# Patient Record
Sex: Male | Born: 1965 | Race: Black or African American | Hispanic: No | Marital: Single | Smoking: Never smoker
Health system: Southern US, Community
[De-identification: ages and names within clinical notes are randomized; demographics above are authoritative.]

## PROBLEM LIST (undated history)

## (undated) DIAGNOSIS — I10 Essential (primary) hypertension: Secondary | ICD-10-CM

---

## 2004-01-29 ENCOUNTER — Emergency Department: Payer: Self-pay | Admitting: Emergency Medicine

## 2008-01-18 ENCOUNTER — Ambulatory Visit: Payer: Self-pay | Admitting: Urology

## 2012-02-11 ENCOUNTER — Ambulatory Visit: Payer: Self-pay | Admitting: Internal Medicine

## 2017-07-21 ENCOUNTER — Emergency Department: Payer: PRIVATE HEALTH INSURANCE

## 2017-07-21 ENCOUNTER — Emergency Department
Admission: EM | Admit: 2017-07-21 | Discharge: 2017-07-21 | Disposition: A | Discharge status: Home / Self Care | Payer: PRIVATE HEALTH INSURANCE | Attending: Emergency Medicine | Admitting: Emergency Medicine

## 2017-07-21 ENCOUNTER — Other Ambulatory Visit: Payer: Self-pay

## 2017-07-21 DIAGNOSIS — R0789 Other chest pain: Secondary | ICD-10-CM | POA: Insufficient documentation

## 2017-07-21 DIAGNOSIS — H1132 Conjunctival hemorrhage, left eye: Secondary | ICD-10-CM | POA: Diagnosis not present

## 2017-07-21 DIAGNOSIS — E860 Dehydration: Secondary | ICD-10-CM | POA: Diagnosis not present

## 2017-07-21 DIAGNOSIS — I10 Essential (primary) hypertension: Secondary | ICD-10-CM | POA: Insufficient documentation

## 2017-07-21 DIAGNOSIS — R55 Syncope and collapse: Secondary | ICD-10-CM | POA: Diagnosis not present

## 2017-07-21 DIAGNOSIS — R252 Cramp and spasm: Secondary | ICD-10-CM | POA: Insufficient documentation

## 2017-07-21 DIAGNOSIS — D709 Neutropenia, unspecified: Secondary | ICD-10-CM | POA: Insufficient documentation

## 2017-07-21 DIAGNOSIS — R079 Chest pain, unspecified: Secondary | ICD-10-CM | POA: Diagnosis present

## 2017-07-21 HISTORY — DX: Essential (primary) hypertension: I10

## 2017-07-21 LAB — CBC WITH DIFFERENTIAL/PLATELET
BASOS PCT: 1 %
Basophils Absolute: 0 10*3/uL (ref 0–0.1)
EOS PCT: 4 %
Eosinophils Absolute: 0.1 10*3/uL (ref 0–0.7)
HEMATOCRIT: 42 % (ref 40.0–52.0)
Hemoglobin: 14.1 g/dL (ref 13.0–18.0)
Lymphocytes Relative: 48 %
Lymphs Abs: 1.4 10*3/uL (ref 1.0–3.6)
MCH: 28.6 pg (ref 26.0–34.0)
MCHC: 33.7 g/dL (ref 32.0–36.0)
MCV: 84.8 fL (ref 80.0–100.0)
MONO ABS: 0.3 10*3/uL (ref 0.2–1.0)
MONOS PCT: 11 %
NEUTROS ABS: 1 10*3/uL — AB (ref 1.4–6.5)
Neutrophils Relative %: 36 %
Platelets: 204 10*3/uL (ref 150–440)
RBC: 4.95 MIL/uL (ref 4.40–5.90)
RDW: 14.2 % (ref 11.5–14.5)
WBC: 2.9 10*3/uL — ABNORMAL LOW (ref 3.8–10.6)

## 2017-07-21 LAB — TROPONIN I
Troponin I: <0.03 ng/mL (ref <0.03)
Troponin I: <0.03 ng/mL (ref <0.03)

## 2017-07-21 LAB — COMPREHENSIVE METABOLIC PANEL
ALBUMIN: 4.1 g/dL (ref 3.5–5.0)
ALT: 25 U/L (ref 0–44)
ANION GAP: 6 (ref 5–15)
AST: 26 U/L (ref 15–41)
Alkaline Phosphatase: 57 U/L (ref 38–126)
BUN: 9 mg/dL (ref 6–20)
CO2: 25 mmol/L (ref 22–32)
Calcium: 8.7 mg/dL — ABNORMAL LOW (ref 8.9–10.3)
Chloride: 106 mmol/L (ref 98–111)
Creatinine, Ser: 0.76 mg/dL (ref 0.61–1.24)
GFR calc Af Amer: >60 mL/min (ref >60)
Glucose, Bld: 104 mg/dL — ABNORMAL HIGH (ref 70–99)
POTASSIUM: 3.8 mmol/L (ref 3.5–5.1)
Sodium: 137 mmol/L (ref 135–145)
TOTAL PROTEIN: 8 g/dL (ref 6.5–8.1)
Total Bilirubin: 1 mg/dL (ref 0.3–1.2)

## 2017-07-21 LAB — CK: CK TOTAL: 293 U/L (ref 49–397)

## 2017-07-21 MED ORDER — SODIUM CHLORIDE 0.9 % IV BOLUS
1000.0000 mL | Freq: Once | INTRAVENOUS | Status: AC
  Administered 2017-07-21: 1000 mL via INTRAVENOUS

## 2017-07-21 NOTE — Discharge Instructions (Addendum)
As I explained to you, your labs show that your white blood cells are low.  It is important that you follow-up with hematology for further evaluation.  Your blood pressure was also elevated in the emergency room.  [facility] for evaluation of high blood pressure and also chest pain.  I believe that your symptoms of pain and cramping are due to dehydration.  Make sure to drink lots of fluids, avoid being out in the heat and avoid alcohol for the next 24 to 48 hours.  If you have new or worsening chest pain, shortness of breath, dizziness please return to the emergency room for evaluation.  Go to [facility] tomorrow to have your left eye evaluate. Return to the ER if you have pain with eye movement, changes in vision, or loss of vision.

## 2017-07-21 NOTE — ED Triage Notes (Signed)
Pt states that he was mowing the yard Sunday, started having cramps, and "passed out" when he went into the house Sunday - pt states that he woke up 5 min later in the floor - he reports Monday that left eye was red - he reports he is cramping and having chest pain today - chest pain is located of left side under breast

## 2017-07-21 NOTE — ED Provider Notes (Signed)
[facility] Emergency Department Provider Note  ____________________________________________  Time seen: Approximately 10:49 AM  I have reviewed the triage vital signs and the nursing notes.   HISTORY  Chief Complaint Chest Pain   HPI Mark Mccall is a 52 y.o. male with history of hypertension who presents for evaluation of leg cramping and chest pain.  Patient reports 4 days ago he was outside in the heat for 3 hours mowing the lawn.  He came back inside, ate and took a nap.  When he woke up from his nap he started having severe cramps in his bilateral lower extremities.  He walked to the kitchen to get some water when he started feeling dizzy and had a syncopal event.  He reports falling on the ground and hitting the left side of his head onto the floor.  He woke up 5 minutes later on the ground.  He did not come to the emergency room because he had to work.  Today's his first day off so he came in for evaluation.  Patient has not seen a doctor in over 10 years.  He does not take any medications at home.  He complains of continuous intermittent cramping on bilateral lower extremities.  He also reports that since yesterday every time he cramps in his legs he has a "weird" sensation in his chest.  He denies any pain or pressure in his chest or shortness of breath, he denies back pain, dizziness, lower extremity weakness or numbness, saddle anesthesia.  He denies neck pain as well.  He also reports that his left eye has been red since the syncopal event.  Denies changes in vision, or pain with eye movement.   Past Medical History:  Diagnosis Date  . Hypertension     Allergies Patient has no known allergies.  FH No h/o sudden death or heart disease  Social History Social History   Tobacco Use  . Smoking status: Never Smoker  . Smokeless tobacco: Never Used  Substance Use Topics  . Alcohol use: Never    Frequency: Never  . Drug use: Never    Review  of Systems  Constitutional: Negative for fever. + syncope Eyes: Negative for visual changes. ENT: Negative for sore throat. Neck: No neck pain  Cardiovascular: Negative for chest pain. Respiratory: Negative for shortness of breath. Gastrointestinal: Negative for abdominal pain, vomiting or diarrhea. Genitourinary: Negative for dysuria. Musculoskeletal: Negative for back pain. + muscle cramps Skin: Negative for rash. Neurological: Negative for headaches, weakness or numbness. Psych: No SI or HI  ____________________________________________   PHYSICAL EXAM:  VITAL SIGNS: ED Triage Vitals  Enc Vitals Group     BP 07/21/17 1007 (!) 152/107     Pulse Rate 07/21/17 1007 64     Resp 07/21/17 1007 16     Temp 07/21/17 1007 98.4 F (36.9 C)     Temp Source 07/21/17 1007 Oral     SpO2 07/21/17 1007 98 %     Weight 07/21/17 1004 235 lb (106.6 kg)     Height 07/21/17 1004 6\' 4"  (1.93 m)     Head Circumference --      Peak Flow --      Pain Score 07/21/17 1003 3     Pain Loc --      Pain Edu? --      Excl. in GC? --     Constitutional: Alert and oriented. No acute distress. Does not appear intoxicated. HEENT Head: Normocephalic and  atraumatic. Face: No facial bony tenderness. Stable midface Ears: No hemotympanum bilaterally. No Battle sign Eyes: No eye injury. PERRL. No raccoon eyes. Injected conjunctiva bilaterally worse on the left Nose: Nontender. No epistaxis. No rhinorrhea Mouth/Throat: Mucous membranes are moist. No oropharyngeal blood. No dental injury. Airway patent without stridor. Normal voice. Neck: no C-collar in place. No midline c-spine tenderness.  Cardiovascular: Normal rate, regular rhythm. Normal and symmetric distal pulses are present in all extremities. Pulmonary/Chest: Chest wall is stable and nontender to palpation/compression. Normal respiratory effort. Breath sounds are normal. No crepitus.  Abdominal: Soft, nontender, non distended. Musculoskeletal:  Nontender with normal full range of motion in all extremities. No deformities. No thoracic or lumbar midline spinal tenderness. Pelvis is stable. Skin: Skin is warm, dry and intact. No abrasions or contutions. Psychiatric: Speech and behavior are appropriate. Neurological: Normal speech and language. Moves all extremities to command. No gross focal neurologic deficits are appreciated.  Glascow Coma Score: 4 - Opens eyes on own 6 - Follows simple motor commands 5 - Alert and oriented GCS: 15  ____________________________________________   LABS (all labs ordered are listed, but only abnormal results are displayed)  Labs Reviewed  CBC WITH DIFFERENTIAL/PLATELET - Abnormal; Notable for the following components:      Result Value   WBC 2.9 (*)    Neutro Abs 1.0 (*)    All other components within normal limits  COMPREHENSIVE METABOLIC PANEL - Abnormal; Notable for the following components:   Glucose, Bld 104 (*)    Calcium 8.7 (*)    All other components within normal limits  TROPONIN I  CK  TROPONIN I  PATHOLOGIST SMEAR REVIEW   ____________________________________________  EKG  ED ECG REPORT I, Nita Sickle, the attending physician, personally viewed and interpreted this ECG.  Normal sinus rhythm, normal intervals, normal axis, no STE or depressions, no evidence of HOCM, AV block, delta wave, ARVD, prolonged QTc, WPW, or Brugada.   ____________________________________________  RADIOLOGY  I have personally reviewed the images performed during this visit and I agree with the Radiologist's read.   Interpretation by Radiologist:  Dg Chest 2 View  Result Date: 07/21/2017 CLINICAL DATA:  52 year old male with a history of syncope EXAM: CHEST - 2 VIEW COMPARISON:  None. FINDINGS: The heart size and mediastinal contours are within normal limits. Both lungs are clear. The visualized skeletal structures are unremarkable. IMPRESSION: Negative for acute cardiopulmonary disease  Electronically Signed   By: Gilmer Mor D.O.   On: 07/21/2017 10:21   Ct Head Wo Contrast  Result Date: 07/21/2017 CLINICAL DATA:  Recent syncopal event EXAM: CT HEAD WITHOUT CONTRAST TECHNIQUE: Contiguous axial images were obtained from the base of the skull through the vertex without intravenous contrast. COMPARISON:  None. FINDINGS: Brain: No evidence of acute infarction, hemorrhage, hydrocephalus, extra-axial collection or mass lesion/mass effect. Vascular: No hyperdense vessel or unexpected calcification. Skull: Normal. Negative for fracture or focal lesion. Sinuses/Orbits: No acute finding. Other: None. IMPRESSION: Normal head CT Electronically Signed   By: Alcide Clever M.D.   On: 07/21/2017 11:09    ____________________________________________   PROCEDURES  Procedure(s) performed: None Procedures Critical Care performed:  None ____________________________________________   INITIAL IMPRESSION / ASSESSMENT AND PLAN / ED COURSE   52 y.o. male with history of hypertension who presents for evaluation of leg cramping, syncope, and chest pain.  #syncope: most likely dehydration in the setting of exertion in the heat, decreased p.o. hydration, intermittent diffuse cramping.  EKG shows no dysrhythmias or ischemia.  Patient will be monitored on telemetry for any evidence of arrhythmias.  # bilateral leg cramping: Intermittent, started after patient mowed the lawn for 3 hours in the sun 5 days ago.  None at this time.  No edema.  No risk factors for DVT.  Bilateral lower extremities are warm and well perfused with strong distal pulses.  CK negative for rhabdo, normal kidney function, normal electrolytes.  Possibly due to dehydration.  Will give IV fluids.  # CP: Patient denies chest pain and reports that he has a weird sensation in his chest every time that his legs cramp which resolve after the cramp goes away.  EKG shows no ischemic changes.  First troponin is negative. Last episode of CP  just prior to arrival. Will get 2nd trop in 3 hours.  # leukopenia: Incidental finding of white count of 2.9 with ANC of 1K. Unclear etiology. All other cell lines are WNL. Pathology smear has been ordered. Patient will be referred to heme/onc for evaluation.  # subconjunctival hemorrhage: no evidence of iritis, normal vision, normal visual fields, PERRL. Supportive care and referral to [facility].  Clinical Course as of Jul 22 1454  Thu Jul 21, 2017  1343 Patient reports resolution of cramping. No further episodes of discomfort on his chest. Telemetry showing no evidence of dysrhythmia.  BP here elevated, recommended f/u with PCP for management, will hold off starting patient on antihypertensive as he is asymptomatic with no evidence of endorgan injury.  Patient's labs also concerning for leukopenia for which she is going to be referred to hematology for evaluation.  I discussed these findings with the patient and the recommendations for follow-up.  Also discussed recommendation to return to the emergency room if he develops chest pain, shortness of breath, weakness or numbness of his legs, abdominal pain, or back pain.   [CV]    Clinical Course User Index [CV] Don PerkingVeronese, [place], MD     As part of my medical decision making, I reviewed the following data within the electronic MEDICAL RECORD NUMBER Nursing notes reviewed and incorporated, Labs reviewed , EKG interpreted , Old chart reviewed, Radiograph reviewed , Notes from prior ED visits and [place] Controlled Substance Database    Pertinent labs & imaging results that were available during my care of the patient were reviewed by me and considered in my medical decision making (see chart for details).    ____________________________________________   FINAL CLINICAL IMPRESSION(S) / ED DIAGNOSES  Final diagnoses:  Atypical chest pain  Leg cramps  Dehydration  Neutropenia, unspecified type (HCC)  Syncope, unspecified syncope type    Subconjunctival hemorrhage of left eye      NEW MEDICATIONS STARTED DURING THIS VISIT:  ED Discharge Orders    None       Note:  This document was prepared using Dragon voice recognition software and may include unintentional dictation errors.    Don PerkingVeronese, [place], MD 07/21/17 [phone]

## 2017-07-21 NOTE — ED Notes (Signed)
Patient transported to CT 

## 2017-07-21 NOTE — ED Notes (Signed)
Dr. Benedetto CoonsVerenose made aware of BP at D/C. Pt cleared for D/C.

## 2017-07-22 LAB — PATHOLOGIST SMEAR REVIEW

## 2017-08-02 ENCOUNTER — Inpatient Hospital Stay: Payer: No Typology Code available for payment source | Attending: Oncology | Admitting: Oncology

## 2018-08-06 ENCOUNTER — Emergency Department (HOSPITAL_COMMUNITY): Payer: Self-pay

## 2018-08-06 ENCOUNTER — Encounter (HOSPITAL_COMMUNITY): Payer: Self-pay | Admitting: Emergency Medicine

## 2018-08-06 ENCOUNTER — Other Ambulatory Visit: Payer: Self-pay

## 2018-08-06 ENCOUNTER — Inpatient Hospital Stay (HOSPITAL_COMMUNITY)
Admission: EM | Admit: 2018-08-06 | Discharge: 2018-08-08 | DRG: 923 | Disposition: A | Discharge status: Home / Self Care | Payer: Self-pay | Attending: Family Medicine | Admitting: Family Medicine

## 2018-08-06 DIAGNOSIS — S12200A Unspecified displaced fracture of third cervical vertebra, initial encounter for closed fracture: Secondary | ICD-10-CM | POA: Diagnosis present

## 2018-08-06 DIAGNOSIS — R55 Syncope and collapse: Secondary | ICD-10-CM | POA: Diagnosis present

## 2018-08-06 DIAGNOSIS — R918 Other nonspecific abnormal finding of lung field: Secondary | ICD-10-CM | POA: Diagnosis present

## 2018-08-06 DIAGNOSIS — Z8042 Family history of malignant neoplasm of prostate: Secondary | ICD-10-CM

## 2018-08-06 DIAGNOSIS — Z23 Encounter for immunization: Secondary | ICD-10-CM

## 2018-08-06 DIAGNOSIS — M79632 Pain in left forearm: Secondary | ICD-10-CM | POA: Diagnosis present

## 2018-08-06 DIAGNOSIS — I951 Orthostatic hypotension: Secondary | ICD-10-CM | POA: Diagnosis present

## 2018-08-06 DIAGNOSIS — X30XXXA Exposure to excessive natural heat, initial encounter: Secondary | ICD-10-CM

## 2018-08-06 DIAGNOSIS — Y92410 Unspecified street and highway as the place of occurrence of the external cause: Secondary | ICD-10-CM

## 2018-08-06 DIAGNOSIS — I10 Essential (primary) hypertension: Secondary | ICD-10-CM | POA: Diagnosis present

## 2018-08-06 DIAGNOSIS — M7989 Other specified soft tissue disorders: Secondary | ICD-10-CM | POA: Diagnosis present

## 2018-08-06 DIAGNOSIS — Z1159 Encounter for screening for other viral diseases: Secondary | ICD-10-CM

## 2018-08-06 DIAGNOSIS — T671XXA Heat syncope, initial encounter: Principal | ICD-10-CM | POA: Diagnosis present

## 2018-08-06 DIAGNOSIS — R911 Solitary pulmonary nodule: Secondary | ICD-10-CM

## 2018-08-06 DIAGNOSIS — E860 Dehydration: Secondary | ICD-10-CM | POA: Diagnosis present

## 2018-08-06 DIAGNOSIS — Z79899 Other long term (current) drug therapy: Secondary | ICD-10-CM

## 2018-08-06 DIAGNOSIS — N179 Acute kidney failure, unspecified: Secondary | ICD-10-CM | POA: Diagnosis present

## 2018-08-06 DIAGNOSIS — R7303 Prediabetes: Secondary | ICD-10-CM

## 2018-08-06 LAB — PREPARE FRESH FROZEN PLASMA
Unit division: 0
Unit division: 0

## 2018-08-06 LAB — COMPREHENSIVE METABOLIC PANEL
ALT: 38 U/L (ref 0–44)
AST: 69 U/L — ABNORMAL HIGH (ref 15–41)
Albumin: 4.6 g/dL (ref 3.5–5.0)
Alkaline Phosphatase: 56 U/L (ref 38–126)
Anion gap: 13 (ref 5–15)
BUN: 29 mg/dL — ABNORMAL HIGH (ref 6–20)
CO2: 20 mmol/L — ABNORMAL LOW (ref 22–32)
Calcium: 9.7 mg/dL (ref 8.9–10.3)
Chloride: 102 mmol/L (ref 98–111)
Creatinine, Ser: 2.8 mg/dL — ABNORMAL HIGH (ref 0.61–1.24)
GFR calc Af Amer: 29 mL/min — ABNORMAL LOW (ref >60)
GFR calc non Af Amer: 25 mL/min — ABNORMAL LOW (ref >60)
Glucose, Bld: 134 mg/dL — ABNORMAL HIGH (ref 70–99)
Potassium: 3.9 mmol/L (ref 3.5–5.1)
Sodium: 135 mmol/L (ref 135–145)
Total Bilirubin: 1 mg/dL (ref 0.3–1.2)
Total Protein: 8.5 g/dL — ABNORMAL HIGH (ref 6.5–8.1)

## 2018-08-06 LAB — TYPE AND SCREEN
ABO/RH(D): O POS
Antibody Screen: NEGATIVE
Unit division: 0
Unit division: 0

## 2018-08-06 LAB — CBC
HCT: 46.9 % (ref 39.0–52.0)
Hemoglobin: 15.5 g/dL (ref 13.0–17.0)
MCH: 28.4 pg (ref 26.0–34.0)
MCHC: 33 g/dL (ref 30.0–36.0)
MCV: 85.9 fL (ref 80.0–100.0)
Platelets: 241 10*3/uL (ref 150–400)
RBC: 5.46 MIL/uL (ref 4.22–5.81)
RDW: 13.3 % (ref 11.5–15.5)
WBC: 8.2 10*3/uL (ref 4.0–10.5)
nRBC: 0 % (ref 0.0–0.2)

## 2018-08-06 LAB — I-STAT CHEM 8, ED
BUN: 30 mg/dL — ABNORMAL HIGH (ref 6–20)
Calcium, Ion: 1.11 mmol/L — ABNORMAL LOW (ref 1.15–1.40)
Chloride: 105 mmol/L (ref 98–111)
Creatinine, Ser: 2.7 mg/dL — ABNORMAL HIGH (ref 0.61–1.24)
Glucose, Bld: 133 mg/dL — ABNORMAL HIGH (ref 70–99)
HCT: 48 % (ref 39.0–52.0)
Hemoglobin: 16.3 g/dL (ref 13.0–17.0)
Potassium: 3.9 mmol/L (ref 3.5–5.1)
Sodium: 137 mmol/L (ref 135–145)
TCO2: 22 mmol/L (ref 22–32)

## 2018-08-06 LAB — BPAM FFP
Blood Product Expiration Date: 202008082359
Blood Product Expiration Date: 202008082359
ISSUE DATE / TIME: 202007191757
ISSUE DATE / TIME: 202007191757
Unit Type and Rh: 600
Unit Type and Rh: 6200

## 2018-08-06 LAB — BPAM RBC
Blood Product Expiration Date: 202008072359
Blood Product Expiration Date: 202008072359
ISSUE DATE / TIME: 202007191757
ISSUE DATE / TIME: 202007191757
Unit Type and Rh: 9500
Unit Type and Rh: 9500

## 2018-08-06 LAB — ABO/RH: ABO/RH(D): O POS

## 2018-08-06 LAB — CDS SEROLOGY

## 2018-08-06 LAB — CBG MONITORING, ED: Glucose-Capillary: 117 mg/dL — ABNORMAL HIGH (ref 70–99)

## 2018-08-06 LAB — PROTIME-INR
INR: 1.1 (ref 0.8–1.2)
Prothrombin Time: 14.2 seconds (ref 11.4–15.2)

## 2018-08-06 LAB — LACTIC ACID, PLASMA: Lactic Acid, Venous: 1.3 mmol/L (ref 0.5–1.9)

## 2018-08-06 LAB — ETHANOL: Alcohol, Ethyl (B): <10 mg/dL (ref <10)

## 2018-08-06 MED ORDER — FENTANYL CITRATE (PF) 100 MCG/2ML IJ SOLN
INTRAMUSCULAR | Status: AC
  Filled 2018-08-06: qty 2

## 2018-08-06 MED ORDER — TETANUS-DIPHTH-ACELL PERTUSSIS 5-2.5-18.5 LF-MCG/0.5 IM SUSP
0.5000 mL | Freq: Once | INTRAMUSCULAR | Status: AC
  Administered 2018-08-06: 0.5 mL via INTRAMUSCULAR
  Filled 2018-08-06: qty 0.5

## 2018-08-06 MED ORDER — FENTANYL CITRATE (PF) 100 MCG/2ML IJ SOLN
INTRAMUSCULAR | Status: AC | PRN
  Administered 2018-08-06: 75 ug via INTRAVENOUS

## 2018-08-06 MED ORDER — SODIUM CHLORIDE 0.9 % IV BOLUS
1000.0000 mL | Freq: Once | INTRAVENOUS | Status: AC
  Administered 2018-08-06: 1000 mL via INTRAVENOUS

## 2018-08-06 NOTE — ED Notes (Signed)
Left hand cleaned with NS
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
## 2018-08-06 NOTE — ED Notes (Signed)
On assessment- pt has sensation to left hand, movement to left hand present.
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
## 2018-08-07 ENCOUNTER — Encounter (HOSPITAL_COMMUNITY): Payer: Self-pay

## 2018-08-07 ENCOUNTER — Inpatient Hospital Stay (HOSPITAL_COMMUNITY): Payer: Self-pay

## 2018-08-07 DIAGNOSIS — N179 Acute kidney failure, unspecified: Secondary | ICD-10-CM

## 2018-08-07 DIAGNOSIS — R55 Syncope and collapse: Secondary | ICD-10-CM | POA: Diagnosis present

## 2018-08-07 DIAGNOSIS — T671XXA Heat syncope, initial encounter: Principal | ICD-10-CM

## 2018-08-07 DIAGNOSIS — R911 Solitary pulmonary nodule: Secondary | ICD-10-CM

## 2018-08-07 LAB — CBC
HCT: 42.7 % (ref 39.0–52.0)
Hemoglobin: 13.9 g/dL (ref 13.0–17.0)
MCH: 28.4 pg (ref 26.0–34.0)
MCHC: 32.6 g/dL (ref 30.0–36.0)
MCV: 87.1 fL (ref 80.0–100.0)
Platelets: 224 10*3/uL (ref 150–400)
RBC: 4.9 MIL/uL (ref 4.22–5.81)
RDW: 13.4 % (ref 11.5–15.5)
WBC: 7.2 10*3/uL (ref 4.0–10.5)
nRBC: 0 % (ref 0.0–0.2)

## 2018-08-07 LAB — URINALYSIS, ROUTINE W REFLEX MICROSCOPIC
Bilirubin Urine: NEGATIVE
Glucose, UA: NEGATIVE mg/dL
Hgb urine dipstick: NEGATIVE
Ketones, ur: NEGATIVE mg/dL
Leukocytes,Ua: NEGATIVE
Nitrite: NEGATIVE
Protein, ur: NEGATIVE mg/dL
Specific Gravity, Urine: 1.016 (ref 1.005–1.030)
pH: 6 (ref 5.0–8.0)

## 2018-08-07 LAB — COMPREHENSIVE METABOLIC PANEL
ALT: 34 U/L (ref 0–44)
AST: 58 U/L — ABNORMAL HIGH (ref 15–41)
Albumin: 3.9 g/dL (ref 3.5–5.0)
Alkaline Phosphatase: 49 U/L (ref 38–126)
Anion gap: 9 (ref 5–15)
BUN: 23 mg/dL — ABNORMAL HIGH (ref 6–20)
CO2: 22 mmol/L (ref 22–32)
Calcium: 8.6 mg/dL — ABNORMAL LOW (ref 8.9–10.3)
Chloride: 106 mmol/L (ref 98–111)
Creatinine, Ser: 1.44 mg/dL — ABNORMAL HIGH (ref 0.61–1.24)
GFR calc Af Amer: >60 mL/min (ref >60)
GFR calc non Af Amer: 55 mL/min — ABNORMAL LOW (ref >60)
Glucose, Bld: 125 mg/dL — ABNORMAL HIGH (ref 70–99)
Potassium: 4.3 mmol/L (ref 3.5–5.1)
Sodium: 137 mmol/L (ref 135–145)
Total Bilirubin: 1 mg/dL (ref 0.3–1.2)
Total Protein: 7.3 g/dL (ref 6.5–8.1)

## 2018-08-07 LAB — CK
Total CK: 1596 U/L — ABNORMAL HIGH (ref 49–397)
Total CK: 1619 U/L — ABNORMAL HIGH (ref 49–397)

## 2018-08-07 LAB — RAPID URINE DRUG SCREEN, HOSP PERFORMED
Amphetamines: NOT DETECTED
Barbiturates: NOT DETECTED
Benzodiazepines: NOT DETECTED
Cocaine: NOT DETECTED
Opiates: POSITIVE — AB
Tetrahydrocannabinol: NOT DETECTED

## 2018-08-07 LAB — BLOOD PRODUCT ORDER (VERBAL) VERIFICATION

## 2018-08-07 LAB — SARS CORONAVIRUS 2 BY RT PCR (HOSPITAL ORDER, PERFORMED IN ~~LOC~~ HOSPITAL LAB): SARS Coronavirus 2: NEGATIVE

## 2018-08-07 LAB — TROPONIN I (HIGH SENSITIVITY)
Troponin I (High Sensitivity): 4 ng/L (ref <18)
Troponin I (High Sensitivity): 5 ng/L (ref <18)

## 2018-08-07 LAB — HEMOGLOBIN A1C
Hgb A1c MFr Bld: 6 % — ABNORMAL HIGH (ref 4.8–5.6)
Mean Plasma Glucose: 125.5 mg/dL

## 2018-08-07 LAB — HIV ANTIBODY (ROUTINE TESTING W REFLEX): HIV Screen 4th Generation wRfx: NONREACTIVE

## 2018-08-07 MED ORDER — HYDROMORPHONE HCL 1 MG/ML IJ SOLN
0.5000 mg | INTRAMUSCULAR | Status: DC | PRN
  Administered 2018-08-07 – 2018-08-08 (×6): 0.5 mg via INTRAVENOUS
  Filled 2018-08-07: qty 0.5
  Filled 2018-08-07: qty 1
  Filled 2018-08-07 (×4): qty 0.5

## 2018-08-07 MED ORDER — ASPIRIN 81 MG PO CHEW
324.0000 mg | CHEWABLE_TABLET | Freq: Once | ORAL | Status: AC
  Administered 2018-08-07: 324 mg via ORAL
  Filled 2018-08-07: qty 4

## 2018-08-07 MED ORDER — ONDANSETRON HCL 4 MG/2ML IJ SOLN
4.0000 mg | Freq: Four times a day (QID) | INTRAMUSCULAR | Status: DC | PRN
  Administered 2018-08-07 – 2018-08-08 (×3): 4 mg via INTRAVENOUS
  Filled 2018-08-07 (×3): qty 2

## 2018-08-07 MED ORDER — ACETAMINOPHEN 650 MG RE SUPP: 650.0000 mg | Freq: Four times a day (QID) | RECTAL | Status: DC | PRN

## 2018-08-07 MED ORDER — HEPARIN SODIUM (PORCINE) 5000 UNIT/ML IJ SOLN
5000.0000 [IU] | Freq: Three times a day (TID) | INTRAMUSCULAR | Status: DC
  Administered 2018-08-07 – 2018-08-08 (×4): 5000 [IU] via SUBCUTANEOUS
  Filled 2018-08-07 (×4): qty 1

## 2018-08-07 MED ORDER — FENTANYL CITRATE (PF) 100 MCG/2ML IJ SOLN
50.0000 ug | Freq: Once | INTRAMUSCULAR | Status: AC
  Administered 2018-08-07: 50 ug via INTRAVENOUS
  Filled 2018-08-07: qty 2

## 2018-08-07 MED ORDER — LACTATED RINGERS IV SOLN
INTRAVENOUS | Status: DC
  Administered 2018-08-07 – 2018-08-08 (×4): via INTRAVENOUS

## 2018-08-07 MED ORDER — SODIUM CHLORIDE 0.9 % IV SOLN
Freq: Once | INTRAVENOUS | Status: AC
  Administered 2018-08-07: 03:00:00 via INTRAVENOUS

## 2018-08-07 MED ORDER — ACETAMINOPHEN 325 MG PO TABS: 650.0000 mg | ORAL_TABLET | Freq: Four times a day (QID) | ORAL | Status: DC | PRN

## 2018-08-07 MED ORDER — SODIUM CHLORIDE 0.9 % IV BOLUS
1000.0000 mL | Freq: Once | INTRAVENOUS | Status: AC
  Administered 2018-08-07: 01:00:00 1000 mL via INTRAVENOUS

## 2018-08-07 MED ORDER — ONDANSETRON HCL 4 MG PO TABS
4.0000 mg | ORAL_TABLET | Freq: Four times a day (QID) | ORAL | Status: DC | PRN
  Administered 2018-08-08 (×2): 4 mg via ORAL
  Filled 2018-08-07 (×2): qty 1

## 2018-08-07 MED ORDER — RAMELTEON 8 MG PO TABS: 8.0000 mg | ORAL_TABLET | Freq: Once | ORAL | Status: DC

## 2018-08-07 NOTE — Progress Notes (Addendum)
0918: Page sent to MD. Patient wants to sit up in bed and eat.   1010: Second page to MD. No response  1130: Explained to patient that physician wants to see him prior to eating. Verbalized understanding.  1200: Spoke with Niece Hillary. Updated on condition. States patient said he does not understand why he cannot eat.   1210: Spoke with daughter Caryl Pina. Updated on condition.  1215: Spoke with patient. Explained why he cannot eat again. Asked for primary contact. Patient states Arta Bruce is under aged and would like information given to [place].  1400: Sling applied to left arm. Ice pack in place. Currently out of [employer].
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
## 2018-08-07 NOTE — Progress Notes (Signed)
PT Cancellation Note  Patient Details Name: Mark Mccall MRN: [id number] DOB: 1965/03/13   Cancelled Treatment:    Reason Eval/Treat Not Completed: Pain limiting ability to participate;Fatigue/lethargy limiting ability to participate.  Pt is tired from OT visit and will try again at another time.   Ramond Dial 08/07/2018, 3:30 PM   Mee Hives, PT MS Acute Rehab Dept. Number: [place]

## 2018-08-07 NOTE — Progress Notes (Signed)
FPTS Interim Progress Note:   S: Paged by RN that patient is experiencing new sudden onset perceived numbness of his left hand after returning from radiology.  Evaluated patient at bedside.  Says that out of nowhere most of his left hand and the distal portion of his wrist, which from progressed when he first told his nurse that it was mainly around his thumb, felt like it "was not there."  If he touches his hand, he can feel it, however when his arm is resting to the side it is like his hand does not exist.  Interestingly, range of motion of his fingers have improved since last evaluation.  Pain slightly better after IV Dilaudid but only minimally (went to 7.5/10 from 8/10 earlier).   O: MSK exam largely unchanged from evaluation around 2030, with the exception of improved ROM of all fingers and left hand without pain on passive stretch.  Sensation to light touch intact.  Radial pulse strong and palpable with good cap refill.  Skin still slightly tense secondary to swelling, however without pain to palpation.  He is still sitting comfortably.  A/p: L hand/forearm crush injury, now with sudden perceived numbness: Concern for developing compartment syndrome remains top on differential, including the worsening swelling over the past several hours now with new sudden onset perceived numbness in his hand/wrist.  However, quite reassured he remains vascularly intact with sensation to light touch preserved and appropriate ROM.  Could also consider swelling solely secondary to crush injury vs retained 4 mm glass with an ulnar portion of hand noted on repeat x-ray tonight vs generous fluid resuscitation. Generalized increase of tissue edema notable on repeat x-rays.  Also considered concurrent C3 fracture, however would not be in the appropriate dermatome, also remains in [place] hard collar.  Ultimately, given progressing symptoms, decided to reach out to specialist for further evaluation sooner rather than later.   We will continue elevating hand with ice and pain control.  RN is attentive and aware of alarm signs/symptoms. Will monitor closely.   Reached out to orthopedist on call, Dr. Alvan Dame, who recommended speaking with the hand specialist. Paged hand specialist, Dr. Lenon Curt, through their office paging service at approximately 23:00. Will try again shortly if no answer.   Patriciaann Clan, DO

## 2018-08-07 NOTE — ED Notes (Signed)
Unsuccessful attempt to give report  rn to call  Me for report
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
## 2018-08-07 NOTE — Plan of Care (Signed)
  Problem: Pain Managment: Goal: General experience of comfort will improve Outcome: Not Progressing  Constant c/o pain in neck and left hand. Medication given as ordered. Left arm in sling, ice packs provided.

## 2018-08-07 NOTE — ED Notes (Signed)
nss switched to lactated ringers  300 from nss

## 2018-08-07 NOTE — ED Notes (Signed)
Report called to rn on 5 w 

## 2018-08-07 NOTE — Progress Notes (Signed)
Mark Mccall is a 53 y.o. male patient admitted from ED awake, alert - oriented  X 4 - no acute distress noted.  VSS - Blood pressure (!) 145/92, pulse 60, temperature 97.9 F (36.6 C), resp. rate 18, height 6\' 5"  (1.956 m), weight 97.2 kg, SpO2 100 %.    IV in place, occlusive dsg intact without redness.  Orientation to room, and floor completed with information packet given to patient/family.  Patient declined safety video at this time.  Admission INP armband ID verified with patient/family, and in place.   SR up x 2, fall assessment complete, with patient and family able to verbalize understanding of risk associated with falls, and verbalized understanding to call nsg before up out of bed.   Call light within reach, patient able to voice, and demonstrate understanding. Skin to skin check completed with second RN, skin integrity documented in chart.   Skin, clean-dry- intact without evidence of bruising.    No evidence of skin break down noted on exam.     Will cont to eval and treat per MD orders.  Howard Pouch, RN 08/07/2018 8:59 AM

## 2018-08-07 NOTE — Progress Notes (Signed)
PT Cancellation Note  Patient Details Name: Mark Mccall MRN: [id number] DOB: May 30, 1965   Cancelled Treatment:    Reason Eval/Treat Not Completed: Pain limiting ability to participate;Fatigue/lethargy limiting ability to participate.  Pt was not able to get back up after doing evaluation for OT, so will try again at another time.   Ramond Dial 08/07/2018, 3:28 PM   Mee Hives, PT MS Acute Rehab Dept. Number: [place] [facility] [place]
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
## 2018-08-08 DIAGNOSIS — R7303 Prediabetes: Secondary | ICD-10-CM

## 2018-08-08 LAB — BASIC METABOLIC PANEL
Anion gap: 8 (ref 5–15)
BUN: 11 mg/dL (ref 6–20)
CO2: 22 mmol/L (ref 22–32)
Calcium: 8.1 mg/dL — ABNORMAL LOW (ref 8.9–10.3)
Chloride: 107 mmol/L (ref 98–111)
Creatinine, Ser: 0.82 mg/dL (ref 0.61–1.24)
GFR calc Af Amer: >60 mL/min (ref >60)
GFR calc non Af Amer: >60 mL/min (ref >60)
Glucose, Bld: 90 mg/dL (ref 70–99)
Potassium: 4.4 mmol/L (ref 3.5–5.1)
Sodium: 137 mmol/L (ref 135–145)

## 2018-08-08 LAB — CK: Total CK: 968 U/L — ABNORMAL HIGH (ref 49–397)

## 2018-08-08 MED ORDER — ONDANSETRON HCL 4 MG PO TABS: 4.0000 mg | ORAL_TABLET | Freq: Three times a day (TID) | ORAL | 0 refills | Status: DC | PRN

## 2018-08-08 MED ORDER — IBUPROFEN 800 MG PO TABS: 800.0000 mg | ORAL_TABLET | Freq: Three times a day (TID) | ORAL | 0 refills | Status: AC | PRN

## 2018-08-08 MED ORDER — OXYCODONE-ACETAMINOPHEN 5-325 MG PO TABS: 1.0000 | ORAL_TABLET | ORAL | 0 refills | Status: AC | PRN

## 2018-08-08 MED ORDER — CEFAZOLIN SODIUM-DEXTROSE 2-4 GM/100ML-% IV SOLN
2.0000 g | Freq: Three times a day (TID) | INTRAVENOUS | Status: DC
  Administered 2018-08-08: 2 g via INTRAVENOUS
  Filled 2018-08-08 (×3): qty 100

## 2018-08-08 NOTE — Progress Notes (Signed)
Mark Mccall to be D/C'ed home per MD order. Discussed with the patient and all questions fully answered. Skin clean, dry and intact without evidence of skin break down, no evidence of skin tears noted.  IV catheter discontinued intact. Site without signs and symptoms of complications. Dressing and pressure applied.  An After Visit Summary was printed and given to the patient. Work note given to patient. Patient escorted via [place], and D/C home via private auto.  [name]  08/08/2018 6:18 PM
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
## 2018-08-08 NOTE — Progress Notes (Signed)
Pharmacy Antibiotic Note  Mark Mccall is a 53 y.o. male admitted on 08/06/2018 with cellulitis.  Pharmacy has been consulted for Cefazolin dosing.  Plan: Cefazolin 2 grams iv Q 8 hours Pharmacy to sign off and follow peripherally for changes in renal function  Height: 6\' 5"  (195.6 cm) Weight: 214 lb 4.6 oz (97.2 kg) IBW/kg (Calculated) : 89.1  Temp (24hrs), Avg:98.5 F (36.9 C), Min:98.4 F (36.9 C), Max:98.5 F (36.9 C)  Recent Labs  Lab 08/06/18 1835 08/06/18 1843 08/07/18 0312 08/08/18 0352  WBC 8.2  --  7.2  --   CREATININE 2.80* 2.70* 1.44* 0.82  LATICACIDVEN 1.3  --   --   --     Estimated Creatinine Clearance: 132.8 mL/min (by C-G formula based on SCr of 0.82 mg/dL).    No Known Allergies   Thank you for allowing pharmacy to be a part of this patient's care.  Tad Moore 08/08/2018 10:27 AM
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
## 2018-08-08 NOTE — Progress Notes (Signed)
Reached Dr. Lenon Curt, recommending continued conservative management.  Additionally has low concern with true compartment syndrome being more rare within the hand.  If no improvement or worsening by the morning, let us know we can reach out again for further recommendations.  Appreciated assistance, will inform RN of updates.  Will continue to monitor patient closely.  Patriciaann Clan

## 2018-08-09 NOTE — Discharge Instructions (Signed)
° °  Follow up with Neurosurgery in two weeks with Dr. Consuella Lose MD Follow up CT chest in 3-6 months with your primary care doctor  If you experience any weakness, nausea or vomiting please seek medical attention  Follow up with your PCP to monitor your blood pressure.

## 2018-08-11 ENCOUNTER — Other Ambulatory Visit: Payer: Self-pay | Admitting: Family Medicine

## 2018-08-11 ENCOUNTER — Other Ambulatory Visit (HOSPITAL_COMMUNITY): Payer: Self-pay | Admitting: Family Medicine

## 2018-08-11 DIAGNOSIS — R918 Other nonspecific abnormal finding of lung field: Secondary | ICD-10-CM

## 2018-08-17 ENCOUNTER — Ambulatory Visit: Payer: No Typology Code available for payment source

## 2018-08-24 ENCOUNTER — Ambulatory Visit: Payer: No Typology Code available for payment source

## 2018-09-04 ENCOUNTER — Other Ambulatory Visit: Payer: Self-pay | Admitting: Family Medicine

## 2018-09-04 ENCOUNTER — Other Ambulatory Visit (HOSPITAL_COMMUNITY): Payer: Self-pay | Admitting: Family Medicine

## 2018-09-04 DIAGNOSIS — M79642 Pain in left hand: Secondary | ICD-10-CM

## 2018-09-12 ENCOUNTER — Ambulatory Visit: Admission: RE | Admit: 2018-09-12 | Payer: PRIVATE HEALTH INSURANCE | Source: Ambulatory Visit

## 2018-09-19 ENCOUNTER — Ambulatory Visit
Admission: RE | Admit: 2018-09-19 | Discharge: 2018-09-19 | Disposition: A | Discharge status: Home / Self Care | Payer: PRIVATE HEALTH INSURANCE | Source: Ambulatory Visit | Attending: Family Medicine | Admitting: Family Medicine

## 2018-09-19 ENCOUNTER — Other Ambulatory Visit: Payer: Self-pay

## 2018-09-19 DIAGNOSIS — M79642 Pain in left hand: Secondary | ICD-10-CM | POA: Insufficient documentation

## 2018-09-21 ENCOUNTER — Encounter: Payer: Self-pay | Admitting: Emergency Medicine

## 2018-09-21 ENCOUNTER — Emergency Department: Payer: PRIVATE HEALTH INSURANCE

## 2018-09-21 ENCOUNTER — Other Ambulatory Visit: Payer: Self-pay

## 2018-09-21 ENCOUNTER — Emergency Department
Admission: EM | Admit: 2018-09-21 | Discharge: 2018-09-21 | Disposition: A | Discharge status: Home / Self Care | Payer: PRIVATE HEALTH INSURANCE | Attending: Student | Admitting: Student

## 2018-09-21 DIAGNOSIS — M60842 Other myositis, left hand: Secondary | ICD-10-CM

## 2018-09-21 DIAGNOSIS — Z79899 Other long term (current) drug therapy: Secondary | ICD-10-CM | POA: Diagnosis not present

## 2018-09-21 DIAGNOSIS — M60042 Infective myositis, left hand: Secondary | ICD-10-CM | POA: Diagnosis not present

## 2018-09-21 DIAGNOSIS — I10 Essential (primary) hypertension: Secondary | ICD-10-CM | POA: Diagnosis not present

## 2018-09-21 DIAGNOSIS — M79642 Pain in left hand: Secondary | ICD-10-CM | POA: Diagnosis present

## 2018-09-21 DIAGNOSIS — L03114 Cellulitis of left upper limb: Secondary | ICD-10-CM | POA: Diagnosis not present

## 2018-09-21 LAB — LACTIC ACID, PLASMA
Lactic Acid, Venous: 1.3 mmol/L (ref 0.5–1.9)
Lactic Acid, Venous: 1.1 mmol/L (ref 0.5–1.9)

## 2018-09-21 LAB — COMPREHENSIVE METABOLIC PANEL
ALT: 68 U/L — ABNORMAL HIGH (ref 0–44)
AST: 32 U/L (ref 15–41)
Albumin: 4.1 g/dL (ref 3.5–5.0)
Alkaline Phosphatase: 39 U/L (ref 38–126)
Anion gap: 11 (ref 5–15)
BUN: 25 mg/dL — ABNORMAL HIGH (ref 6–20)
CO2: 24 mmol/L (ref 22–32)
Calcium: 9.2 mg/dL (ref 8.9–10.3)
Chloride: 104 mmol/L (ref 98–111)
Creatinine, Ser: 1.01 mg/dL (ref 0.61–1.24)
GFR calc Af Amer: >60 mL/min (ref >60)
GFR calc non Af Amer: >60 mL/min (ref >60)
Glucose, Bld: 109 mg/dL — ABNORMAL HIGH (ref 70–99)
Potassium: 4.3 mmol/L (ref 3.5–5.1)
Sodium: 139 mmol/L (ref 135–145)
Total Bilirubin: 0.7 mg/dL (ref 0.3–1.2)
Total Protein: 7.6 g/dL (ref 6.5–8.1)

## 2018-09-21 LAB — CBC WITH DIFFERENTIAL/PLATELET
Abs Immature Granulocytes: 0.01 10*3/uL (ref 0.00–0.07)
Basophils Absolute: 0.1 10*3/uL (ref 0.0–0.1)
Basophils Relative: 1 %
Eosinophils Absolute: 0.1 10*3/uL (ref 0.0–0.5)
Eosinophils Relative: 2 %
HCT: 39 % (ref 39.0–52.0)
Hemoglobin: 13 g/dL (ref 13.0–17.0)
Immature Granulocytes: 0 %
Lymphocytes Relative: 26 %
Lymphs Abs: 1.2 10*3/uL (ref 0.7–4.0)
MCH: 28.4 pg (ref 26.0–34.0)
MCHC: 33.3 g/dL (ref 30.0–36.0)
MCV: 85.2 fL (ref 80.0–100.0)
Monocytes Absolute: 0.4 10*3/uL (ref 0.1–1.0)
Monocytes Relative: 8 %
Neutro Abs: 2.9 10*3/uL (ref 1.7–7.7)
Neutrophils Relative %: 63 %
Platelets: 226 10*3/uL (ref 150–400)
RBC: 4.58 MIL/uL (ref 4.22–5.81)
RDW: 13.5 % (ref 11.5–15.5)
WBC: 4.6 10*3/uL (ref 4.0–10.5)
nRBC: 0 % (ref 0.0–0.2)

## 2018-09-21 LAB — C-REACTIVE PROTEIN: CRP: <0.8 mg/dL (ref <1.0)

## 2018-09-21 LAB — SEDIMENTATION RATE: Sed Rate: 5 mm/hr (ref 0–20)

## 2018-09-21 MED ORDER — IBUPROFEN 600 MG PO TABS: 600.0000 mg | ORAL_TABLET | Freq: Four times a day (QID) | ORAL | 0 refills | Status: DC | PRN

## 2018-09-21 MED ORDER — KETOROLAC TROMETHAMINE 30 MG/ML IJ SOLN
15.0000 mg | Freq: Once | INTRAMUSCULAR | Status: AC
  Administered 2018-09-21: 15 mg via INTRAVENOUS
  Filled 2018-09-21: qty 1

## 2018-09-21 MED ORDER — OXYCODONE-ACETAMINOPHEN 5-325 MG PO TABS
1.0000 | ORAL_TABLET | Freq: Once | ORAL | Status: AC
  Administered 2018-09-21: 1 via ORAL
  Filled 2018-09-21: qty 1

## 2018-09-21 MED ORDER — VANCOMYCIN HCL 10 G IV SOLR
2000.0000 mg | Freq: Once | INTRAVENOUS | Status: AC
  Administered 2018-09-21: 2000 mg via INTRAVENOUS
  Filled 2018-09-21: qty 2000

## 2018-09-21 MED ORDER — PIPERACILLIN-TAZOBACTAM 3.375 G IVPB 30 MIN
3.3750 g | Freq: Once | INTRAVENOUS | Status: AC
  Administered 2018-09-21: 3.375 g via INTRAVENOUS
  Filled 2018-09-21: qty 50

## 2018-09-21 MED ORDER — SODIUM CHLORIDE 0.9% FLUSH: 3.0000 mL | Freq: Once | INTRAVENOUS | Status: DC

## 2018-09-21 MED ORDER — DOXYCYCLINE HYCLATE 100 MG PO CAPS: 100.0000 mg | ORAL_CAPSULE | Freq: Two times a day (BID) | ORAL | 0 refills | Status: AC
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
## 2018-09-21 NOTE — ED Notes (Signed)
Patient asking about wait time.  Explained that it may be awhile, but he really needs to stay.

## 2018-09-21 NOTE — ED Notes (Signed)
EDP, [facility], notified of completed antibiotics.

## 2018-09-21 NOTE — Discharge Instructions (Addendum)
Thank you for letting us take care of you in the emergency department today.   Please continue to take any regular, prescribed medications.   New medications we have prescribed:  - doxycycline - an antibiotic, please take as directed for the entire course - ibuprofen - please use as needed, as directed to help with inflammation and pain  Please follow up with: - The hand doctor, at your appointment tomorrow.  If for some reason you cannot make this appointment, please contact the hand doctor below to make an appointment for follow-up. - Your primary care doctor to review your ER visit and follow up on your symptoms.   Please return to the ER for any new or worsening symptoms.
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
## 2018-09-26 LAB — CULTURE, BLOOD (SINGLE)
Culture: NO GROWTH
Special Requests: ADEQUATE

## 2019-04-01 ENCOUNTER — Emergency Department
Admission: EM | Admit: 2019-04-01 | Discharge: 2019-04-01 | Disposition: A | Discharge status: Home / Self Care | Payer: Self-pay | Attending: Emergency Medicine | Admitting: Emergency Medicine

## 2019-04-01 ENCOUNTER — Encounter: Payer: Self-pay | Admitting: Physician Assistant

## 2019-04-01 ENCOUNTER — Emergency Department: Payer: Self-pay

## 2019-04-01 ENCOUNTER — Other Ambulatory Visit: Payer: Self-pay

## 2019-04-01 DIAGNOSIS — M25462 Effusion, left knee: Secondary | ICD-10-CM | POA: Insufficient documentation

## 2019-04-01 DIAGNOSIS — Z79899 Other long term (current) drug therapy: Secondary | ICD-10-CM | POA: Insufficient documentation

## 2019-04-01 DIAGNOSIS — I10 Essential (primary) hypertension: Secondary | ICD-10-CM | POA: Insufficient documentation

## 2019-04-01 NOTE — Discharge Instructions (Signed)
Your exam and XR are normal at this time. There is no evidence of fracture for dislocation. You should take OTC Tylenol for pain, as needed. Rest with ice to reduce swelling. Follow-up with Dr. Burnadette Pop for ongoing symptoms.

## 2019-04-01 NOTE — ED Provider Notes (Signed)
[facility] Emergency Department Provider Note ____________________________________________  Time seen: 1226  I have reviewed the triage vital signs and the nursing notes.  HISTORY  Chief Complaint  Leg Pain  HPI Mark [facility] is a 54 y.o. male with a history of HTN, presents to the ED for evaluation for left knee swelling. He reports onset after [place] He denies any injury, trauma, twist, or fall. He denies any history of chronic or ongoing knee problems. He denies any pain or disability. He reports fullness and swelling to the left knee.    Past Medical History:  Diagnosis Date  . Hypertension     Patient Active Problem List   Diagnosis Date Noted  . Prediabetes 08/08/2018  . Syncope 08/07/2018  . Pulmonary nodule 08/07/2018    History reviewed. No pertinent surgical history.  Prior to Admission medications   Medication Sig Start Date End Date Taking? Authorizing Provider  lisinopril (ZESTRIL) 10 MG tablet Take 10 mg by mouth daily. 06/25/18   [provider]    Allergies Patient has no known allergies.  History reviewed. No pertinent family history.  Social History Social History   Tobacco Use  . Smoking status: Never Smoker  . Smokeless tobacco: Never Used  Substance Use Topics  . Alcohol use: Not Currently  . Drug use: Never    Review of Systems  Constitutional: Negative for fever. Cardiovascular: Negative for chest pain. Respiratory: Negative for shortness of breath. Musculoskeletal: Negative for back pain. Left knee swelling.  Skin: Negative for rash. Neurological: Negative for headaches, focal weakness or numbness. ____________________________________________  PHYSICAL EXAM:  VITAL SIGNS: ED Triage Vitals  Enc Vitals Group     BP 04/01/19 1122 (S) (!) 172/112     Pulse Rate 04/01/19 1122 84     Resp 04/01/19 1122 18     Temp 04/01/19 1122 98.4 F (36.9 C)     Temp Source 04/01/19 1122  Oral     SpO2 04/01/19 1122 94 %     Weight 04/01/19 1122 225 lb (102.1 kg)     Height 04/01/19 1122 6\' 5"  (1.956 m)     Head Circumference --      Peak Flow --      Pain Score 04/01/19 1118 1     Pain Loc --      Pain Edu? --      Excl. in [place]? --     Constitutional: Alert and oriented. Well appearing and in no distress. Head: Normocephalic and atraumatic. Eyes: Conjunctivae are normal. Normal extraocular movements Cardiovascular: Normal rate, regular rhythm. Normal distal pulses. Respiratory: Normal respiratory effort. No wheezes/rales/rhonchi. Musculoskeletal: Left knee with moderate effusion noted.  Patient with normal active range of motion of the knee without difficulty.  Patella ballottement is appreciated.  No patella laxity.  No significant valgus or varus joint stress.  No tenderness to the medial or lateral joint line.  Patient is without any popliteal space fullness or calf tenderness distally.  Negative anterior/posterior drawer.  Nontender with normal range of motion in all extremities.  Neurologic:  Normal gait without ataxia. Normal speech and language. No gross focal neurologic deficits are appreciated. Skin:  Skin is warm, dry and intact. No rash noted. ____________________________________________   RADIOLOGY  DG Left Knee   IMPRESSION: Suspect small joint effusion.  No other acute finding by plain radiography ____________________________________________  PROCEDURES  Ace bandage  Procedures ____________________________________________  INITIAL IMPRESSION / ASSESSMENT AND PLAN /  ED COURSE  DDX: knee sprain, effusion, gout, septic arthritis, bursitis  Patient with ED evaluation of left knee swelling.  He denies any known injury or accident preceding the onset. His exam is benign at this time. No XR evidence of fracture or dislocation. There is no evidence of internal derangement to the joint. He is reassured by his exam and XR.  Patient is placed in Ace  bandage for support.  He is encouraged to take over-the-counter Tylenol due to his poorly controlled blood pressure.  He will follow with primary provider for ongoing symptoms or return to the ED as necessary.  He will rest, ice, elevate the knee as needed.   [facility] was evaluated in Emergency Department on 04/01/2019 for the symptoms described in the history of present illness. He was evaluated in the context of the global COVID-19 pandemic, which necessitated consideration that the patient might be at risk for infection with the SARS-CoV-2 virus that causes COVID-19. Institutional protocols and algorithms that pertain to the evaluation of patients at risk for COVID-19 are in a state of rapid change based on information released by regulatory bodies including the CDC and federal and state organizations. These policies and algorithms were followed during the patient's care in the ED. ____________________________________________  FINAL CLINICAL IMPRESSION(S) / ED DIAGNOSES  Final diagnoses:  Knee effusion, left      Karmen Stabs, Charlesetta Ivory, PA-C 04/01/19 1350    Concha Se, MD 04/02/19 1523

## 2019-04-01 NOTE — ED Triage Notes (Signed)
Pt arrived via POV with reports of left leg pain, states he was out in the woods 2 days ago, started having swelling to left side to knee, pt isn't sure if he was bit by something.   Pt ambulatory in lobby without difficulty.

## 2019-04-06 ENCOUNTER — Emergency Department
Admission: EM | Admit: 2019-04-06 | Discharge: 2019-04-06 | Disposition: A | Discharge status: Home / Self Care | Payer: PRIVATE HEALTH INSURANCE | Attending: Emergency Medicine | Admitting: Emergency Medicine

## 2019-04-06 ENCOUNTER — Other Ambulatory Visit: Payer: Self-pay

## 2019-04-06 DIAGNOSIS — M25562 Pain in left knee: Secondary | ICD-10-CM

## 2019-04-06 MED ORDER — ETODOLAC 400 MG PO TABS: 400.0000 mg | ORAL_TABLET | Freq: Two times a day (BID) | ORAL | 0 refills | Status: AC

## 2019-04-06 NOTE — Discharge Instructions (Addendum)
Call make an appointment with Dr. Franco Collet who is the orthopedist on call today.  Continue to ice and also wear the knee immobilizer when you are up walking.  Is not necessary for you to wear it to bed.  Begin taking the lodine twice a day with food for inflammation.  Also have your primary care provider recheck your blood pressure is elevated in the ED.  Remember take your Zestril every day.  It may be necessary for your primary care provider to change your blood pressure medication.

## 2019-04-06 NOTE — ED Triage Notes (Signed)
Pt seen Sunday for left knee pain. Not getting better. States it's actually getting worse. Ambulatory with pain to room.

## 2019-04-06 NOTE — ED Triage Notes (Signed)
First RN Note: Pt presents to ED via POV with c/o recheck of L knee, pt seen on 3/14 for same with swelling and pain to side of L knee, pt states is here for a recheck.

## 2019-04-06 NOTE — ED Provider Notes (Signed)
[facility] Emergency Department Provider Note   ____________________________________________   First MD Initiated Contact with Patient 04/06/19 1238     (approximate)  I have reviewed the triage vital signs and the nursing notes.   HISTORY  Chief Complaint Knee Pain   HPI Mark Mccall is a 54 y.o. male presents to the ED with complaint of left knee pain.  Patient states that he was seen on 3/14 for the same at which time x-rays showed a small area of effusion.  Patient states that he believes he was walking in the woods and may have twisted his knee.  He continues to wear the Ace wrap which he states gives little support.  He has not called make an appointment with orthopedics yet.        Past Medical History:  Diagnosis Date  . Hypertension     Patient Active Problem List   Diagnosis Date Noted  . Prediabetes 08/08/2018  . Syncope 08/07/2018  . Pulmonary nodule 08/07/2018    History reviewed. No pertinent surgical history.  Prior to Admission medications   Medication Sig Start Date End Date Taking? Authorizing Provider  etodolac (LODINE) 400 MG tablet Take 1 tablet (400 mg total) by mouth 2 (two) times daily. 04/06/19 04/05/20  Tommi Rumps, PA-C  lisinopril (ZESTRIL) 10 MG tablet Take 10 mg by mouth daily. 06/25/18   [provider]    Allergies Patient has no known allergies.  History reviewed. No pertinent family history.  Social History Social History   Tobacco Use  . Smoking status: Never Smoker  . Smokeless tobacco: Never Used  Substance Use Topics  . Alcohol use: Not Currently  . Drug use: Never    Review of Systems Constitutional: No fever/chills Eyes: No visual changes. Cardiovascular: Denies chest pain. Respiratory: Denies shortness of breath. Gastrointestinal: .  No nausea, no vomiting.  Musculoskeletal: Positive for left knee pain. Skin: Negative for rash. Neurological: Negative for headaches,  focal weakness or numbness. ____________________________________________   PHYSICAL EXAM:  VITAL SIGNS: ED Triage Vitals [04/06/19 1233]  Enc Vitals Group     BP (!) 177/109     Pulse Rate 80     Resp 18     Temp 98.4 F (36.9 C)     Temp Source Oral     SpO2 97 %     Weight 224 lb 13.9 oz (102 kg)     Height 6\' 5"  (1.956 m)     Head Circumference      Peak Flow      Pain Score 7     Pain Loc      Pain Edu?      Excl. in GC?    Constitutional: Alert and oriented. Well appearing and in no acute distress. Eyes: Conjunctivae are normal.  Head: Atraumatic. Neck: No stridor.   Cardiovascular: Normal rate, regular rhythm. Grossly normal heart sounds.  Good peripheral circulation. Respiratory: Normal respiratory effort.  No retractions. Lungs CTAB. Musculoskeletal: Examination of left knee there is no gross deformity but soft tissue edema is present.  There is no appreciated effusion.  Range of motion is decreased secondary to patient's discomfort.  There is some laxity on stressing of the lateral ligaments.  No warmth or redness is seen.  No discoloration. Neurologic:  Normal speech and language. No gross focal neurologic deficits are appreciated. No gait instability. Skin:  Skin is warm, dry and intact. Psychiatric: Mood and affect are normal. Speech and behavior  are normal.  ____________________________________________   LABS (all labs ordered are listed, but only abnormal results are displayed)  Labs Reviewed - No data to display  RADIOLOGY  ED MD interpretation: X-ray from 04/01/2018 was reviewed.  Official radiology report(s): No results found.  ____________________________________________   PROCEDURES  Procedure(s) performed (including Critical Care):  Procedures  Knee immobilizer was applied by RN. ____________________________________________   INITIAL IMPRESSION / ASSESSMENT AND PLAN / ED COURSE  As part of my medical decision making, I reviewed the  following data within the electronic MEDICAL RECORD NUMBER Notes from prior ED visits and [place] Controlled Substance Database  54 year old male presents to the ED with complaint of left knee pain.  He states that he wants to get his knee "rechecked".  Patient was seen on 314 at which time there was a small effusion that may have resulted from him walking in the woods and twisting his knee.  Patient has continued to use an Ace wrap which he states does not give much support.  He was placed in a knee immobilizer and also started on etodolac 400 mg twice daily with food.  He is to follow-up with Dr. Gaspar Bidding who is the orthopedist on call if he continues to have problems with his knee.  He is also instructed to continue ice and elevation as needed.  ____________________________________________   FINAL CLINICAL IMPRESSION(S) / ED DIAGNOSES  Final diagnoses:  Acute pain of left knee     ED Discharge Orders         Ordered    etodolac (LODINE) 400 MG tablet  2 times daily     04/06/19 1410           Note:  This document was prepared using Dragon voice recognition software and may include unintentional dictation errors.    Johnn Hai, PA-C 04/06/19 1520    Harvest Dark, MD 04/06/19 1535

## 2019-11-12 IMAGING — CR DG CHEST 2V
1 series · 2 of 2 positions shown · non-contrast
Comparison: None.

CLINICAL DATA: 51-year-old male with a history of syncope

EXAM:
CHEST - 2 VIEW

[Series 1: dg chest 2 view · 0.14mm/px · 2 of 2 slices shown]
[im 1/2]
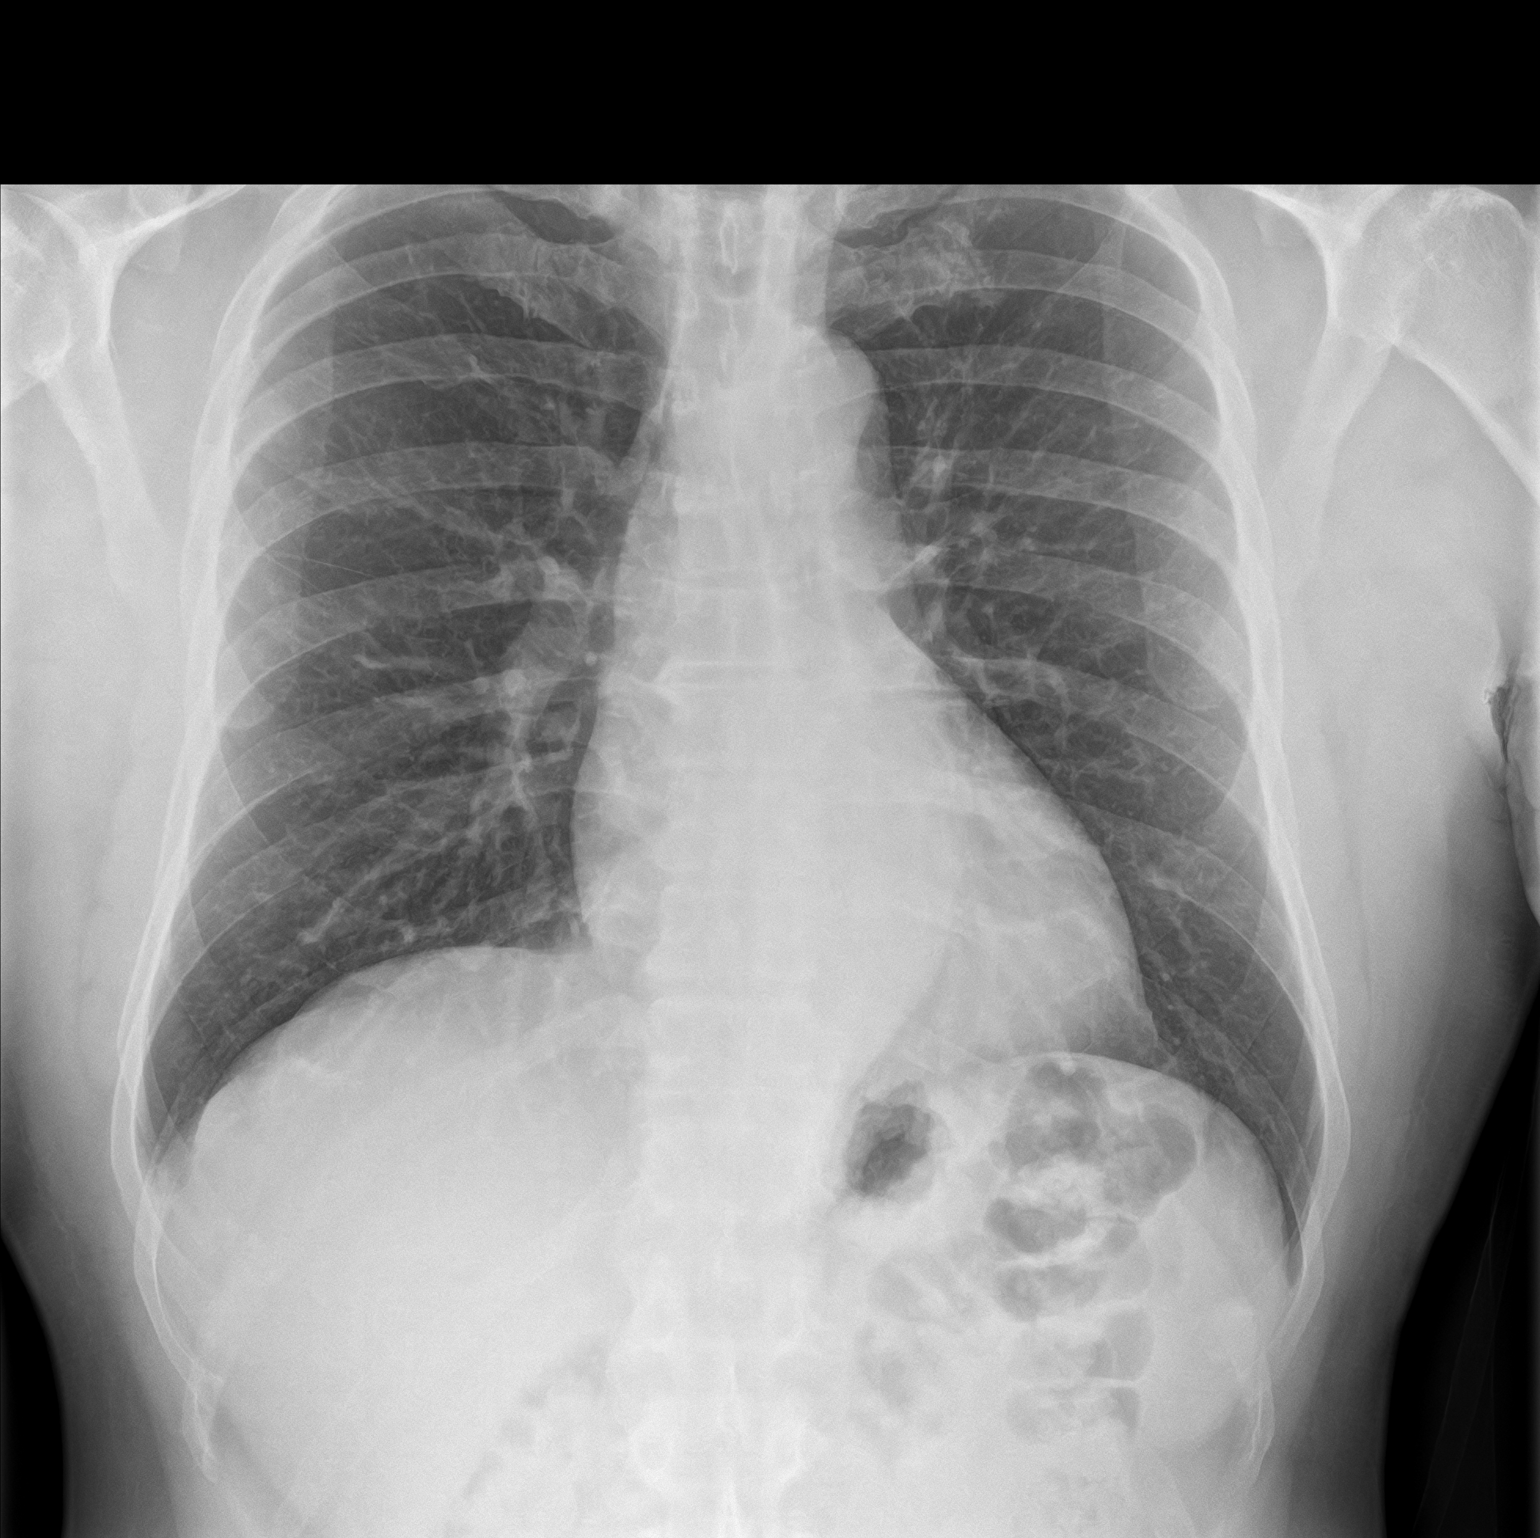
[im 2/2]
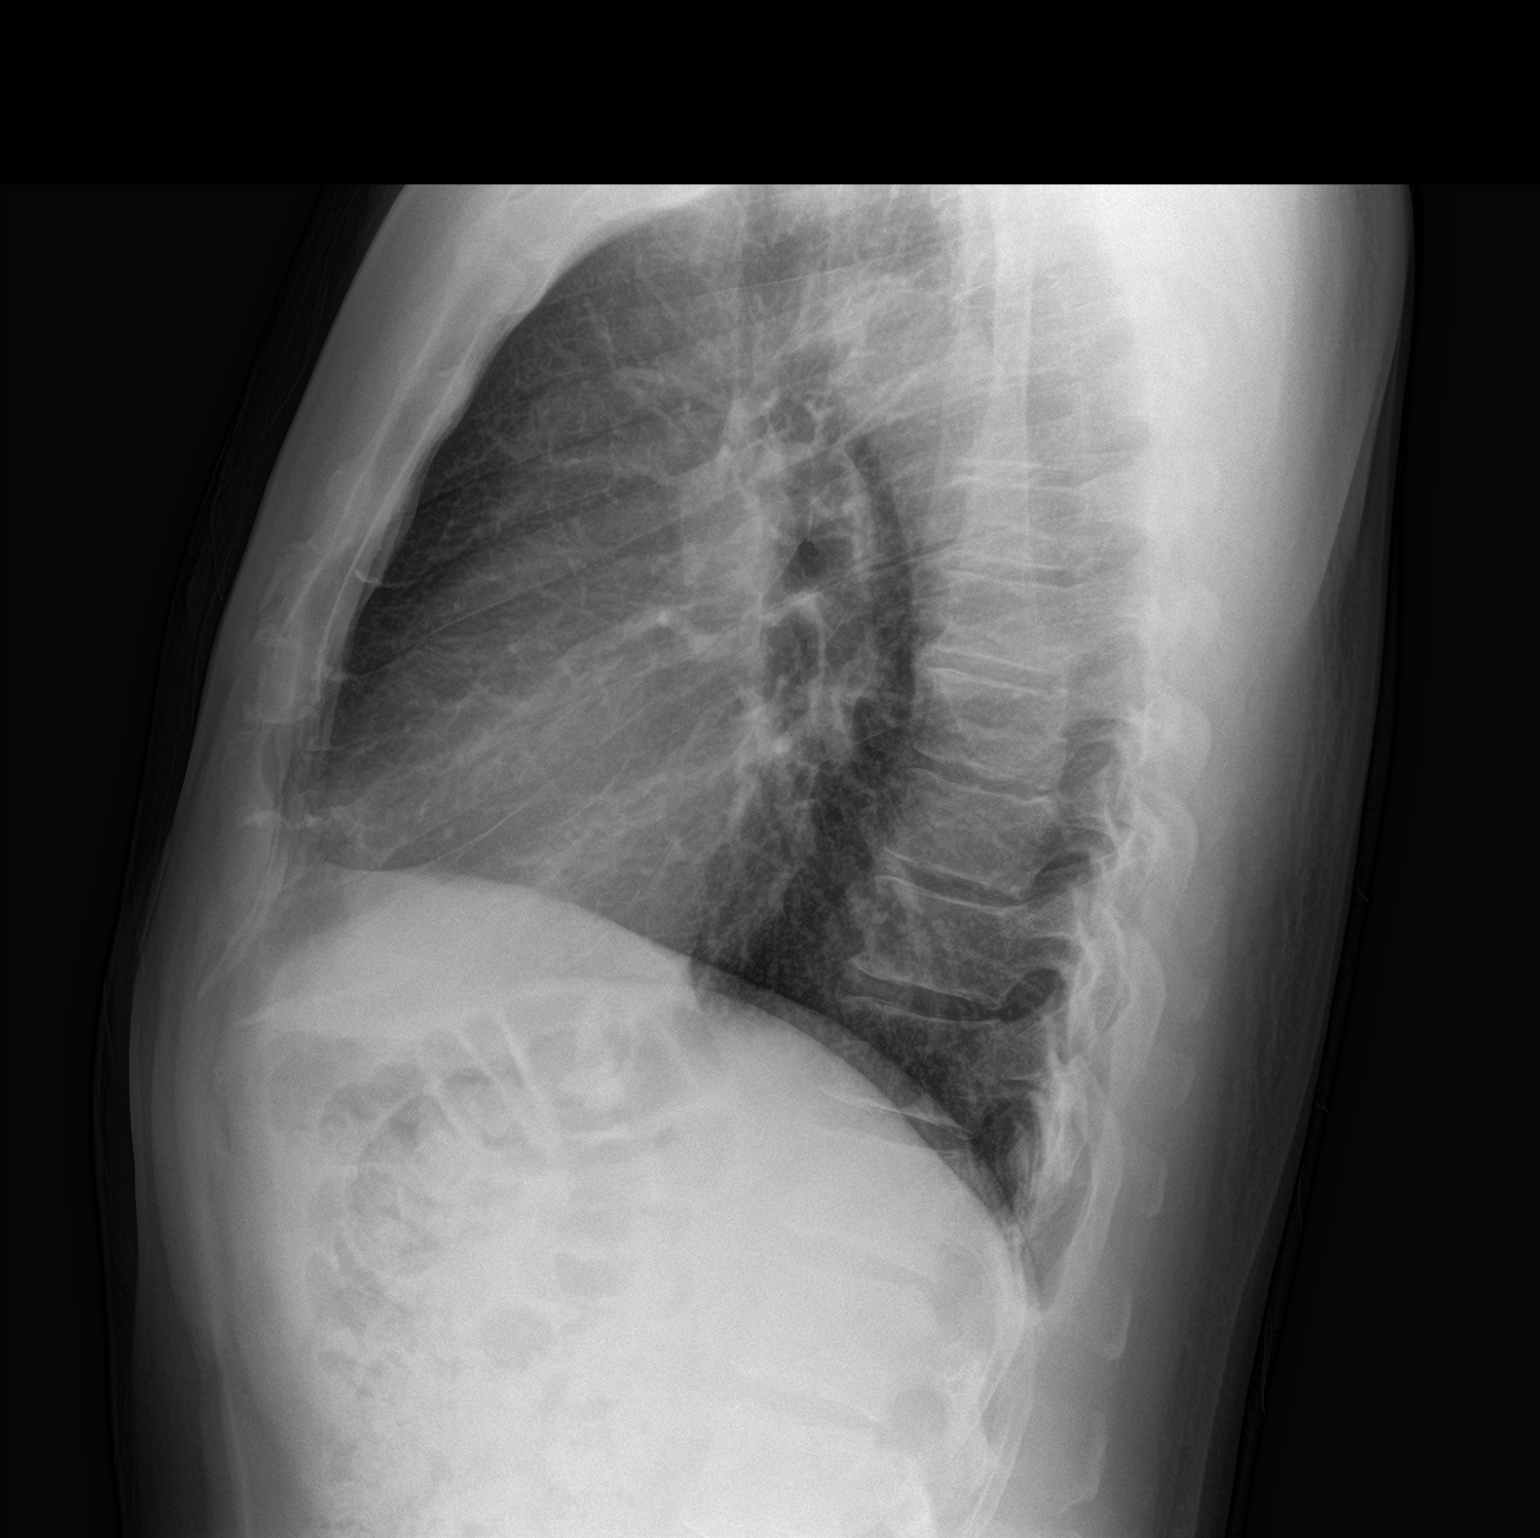

[2 of 2 positions shown; findings below may reference images not displayed]

FINDINGS: The heart size and mediastinal contours are within normal limits.
Both lungs are clear. The visualized skeletal structures are
unremarkable.
IMPRESSION: Negative for acute cardiopulmonary disease

## 2020-02-20 ENCOUNTER — Encounter: Payer: Self-pay | Admitting: Emergency Medicine

## 2020-02-20 ENCOUNTER — Emergency Department
Admission: EM | Admit: 2020-02-20 | Discharge: 2020-02-20 | Disposition: A | Discharge status: Home / Self Care | Payer: BC Managed Care – PPO | Attending: Emergency Medicine | Admitting: Emergency Medicine

## 2020-02-20 ENCOUNTER — Other Ambulatory Visit: Payer: Self-pay

## 2020-02-20 DIAGNOSIS — U071 COVID-19: Secondary | ICD-10-CM | POA: Insufficient documentation

## 2020-02-20 DIAGNOSIS — R55 Syncope and collapse: Secondary | ICD-10-CM

## 2020-02-20 DIAGNOSIS — I10 Essential (primary) hypertension: Secondary | ICD-10-CM | POA: Insufficient documentation

## 2020-02-20 DIAGNOSIS — Z79899 Other long term (current) drug therapy: Secondary | ICD-10-CM | POA: Insufficient documentation

## 2020-02-20 LAB — BASIC METABOLIC PANEL
Anion gap: 11 (ref 5–15)
BUN: 14 mg/dL (ref 6–20)
CO2: 23 mmol/L (ref 22–32)
Calcium: 8.5 mg/dL — ABNORMAL LOW (ref 8.9–10.3)
Chloride: 99 mmol/L (ref 98–111)
Creatinine, Ser: 0.93 mg/dL (ref 0.61–1.24)
GFR, Estimated: >60 mL/min (ref >60)
Glucose, Bld: 122 mg/dL — ABNORMAL HIGH (ref 70–99)
Potassium: 3.5 mmol/L (ref 3.5–5.1)
Sodium: 133 mmol/L — ABNORMAL LOW (ref 135–145)

## 2020-02-20 LAB — CBC
HCT: 44.8 % (ref 39.0–52.0)
Hemoglobin: 15.1 g/dL (ref 13.0–17.0)
MCH: 28.2 pg (ref 26.0–34.0)
MCHC: 33.7 g/dL (ref 30.0–36.0)
MCV: 83.6 fL (ref 80.0–100.0)
Platelets: 153 10*3/uL (ref 150–400)
RBC: 5.36 MIL/uL (ref 4.22–5.81)
RDW: 12.9 % (ref 11.5–15.5)
WBC: 3 10*3/uL — ABNORMAL LOW (ref 4.0–10.5)
nRBC: 0 % (ref 0.0–0.2)

## 2020-02-20 LAB — TROPONIN I (HIGH SENSITIVITY): Troponin I (High Sensitivity): 5 ng/L (ref <18)

## 2020-02-20 MED ORDER — ONDANSETRON 4 MG PO TBDP: 4.0000 mg | ORAL_TABLET | Freq: Three times a day (TID) | ORAL | 0 refills | Status: AC | PRN

## 2020-02-20 MED ORDER — LACTATED RINGERS IV BOLUS
1000.0000 mL | Freq: Once | INTRAVENOUS | Status: AC
  Administered 2020-02-20: 1000 mL via INTRAVENOUS

## 2020-02-20 MED ORDER — ONDANSETRON HCL 4 MG/2ML IJ SOLN
4.0000 mg | Freq: Once | INTRAMUSCULAR | Status: AC
  Administered 2020-02-20: 4 mg via INTRAVENOUS
  Filled 2020-02-20: qty 2

## 2020-02-20 NOTE — ED Provider Notes (Signed)
[facility] Emergency Department Provider Note   ____________________________________________   Event Date/Time   First MD Initiated Contact with Patient 02/20/20 1007     (approximate)  I have reviewed the triage vital signs and the nursing notes.   HISTORY  Chief Complaint Loss of Consciousness    HPI Mark Mccall is a 55 y.o. male with past medical history of hypertension who presents to the ED complaining of syncope. Patient reports that he has been feeling weak and fatigued for the past couple of days after he tested positive for COVID-19 8 days ago. He denies any fevers, cough, shortness of breath, vomiting, or diarrhea, but he has felt nauseous at times. He went to go to the bathroom earlier this morning and when he stood up he felt very lightheaded, eventually lost consciousness. He is unsure how long he was unconscious for, he did fall to the ground and is unsure whether he hit his head. He denies any headache, vision changes, speech changes, numbness, or weakness. He reports similar episodes of passing out in the past, but he has never been evaluated for them.        Past Medical History:  Diagnosis Date  . Hypertension     Patient Active Problem List   Diagnosis Date Noted  . Prediabetes 08/08/2018  . Syncope 08/07/2018  . Pulmonary nodule 08/07/2018    History reviewed. No pertinent surgical history.  Prior to Admission medications   Medication Sig Start Date End Date Taking? Authorizing Provider  ondansetron (ZOFRAN ODT) 4 MG disintegrating tablet Take 1 tablet (4 mg total) by mouth every 8 (eight) hours as needed for nausea or vomiting. 02/20/20  Yes Chesley Noon, MD  etodolac (LODINE) 400 MG tablet Take 1 tablet (400 mg total) by mouth 2 (two) times daily. 04/06/19 04/05/20  Tommi Rumps, PA-C  lisinopril (ZESTRIL) 10 MG tablet Take 10 mg by mouth daily. 06/25/18   [provider]    Allergies Patient has no known  allergies.  History reviewed. No pertinent family history.  Social History Social History   Tobacco Use  . Smoking status: Never Smoker  . Smokeless tobacco: Never Used  Vaping Use  . Vaping Use: Never used  Substance Use Topics  . Alcohol use: Not Currently  . Drug use: Never    Review of Systems  Constitutional: No fever/chills Eyes: No visual changes. ENT: No sore throat. Cardiovascular: Denies chest pain. Positive for syncope. Respiratory: Denies shortness of breath. Gastrointestinal: No abdominal pain. Positive for nausea, no vomiting.  No diarrhea.  No constipation. Genitourinary: Negative for dysuria. Musculoskeletal: Negative for back pain. Skin: Negative for rash. Neurological: Negative for headaches, focal weakness or numbness.  ____________________________________________   PHYSICAL EXAM:  VITAL SIGNS: ED Triage Vitals  Enc Vitals Group     BP 02/20/20 0952 114/70     Pulse Rate 02/20/20 0952 83     Resp 02/20/20 0952 18     Temp 02/20/20 0952 99.1 F (37.3 C)     Temp Source 02/20/20 0952 Oral     SpO2 02/20/20 0952 98 %     Weight 02/20/20 0953 235 lb (106.6 kg)     Height 02/20/20 0953 6\' 5"  (1.956 m)     Head Circumference --      Peak Flow --      Pain Score 02/20/20 0957 0     Pain Loc --      Pain Edu? --  Excl. in GC? --     Constitutional: Alert and oriented. Eyes: Conjunctivae are normal. Head: Atraumatic. Nose: No congestion/rhinnorhea. Mouth/Throat: Mucous membranes are moist. Neck: Normal ROM, no midline cervical spine tenderness. Cardiovascular: Normal rate, regular rhythm. Grossly normal heart sounds. 2+ radial pulses bilaterally. Respiratory: Normal respiratory effort.  No retractions. Lungs CTAB. Gastrointestinal: Soft and nontender. No distention. Genitourinary: deferred Musculoskeletal: No lower extremity tenderness nor edema. Neurologic:  Normal speech and language. No gross focal neurologic deficits are  appreciated. Skin:  Skin is warm, dry and intact. No rash noted. Psychiatric: Mood and affect are normal. Speech and behavior are normal.  ____________________________________________   LABS (all labs ordered are listed, but only abnormal results are displayed)  Labs Reviewed  BASIC METABOLIC PANEL - Abnormal; Notable for the following components:      Result Value   Sodium 133 (*)    Glucose, Bld 122 (*)    Calcium 8.5 (*)    All other components within normal limits  CBC - Abnormal; Notable for the following components:   WBC 3.0 (*)    All other components within normal limits  URINALYSIS, COMPLETE (UACMP) WITH MICROSCOPIC  CBG MONITORING, ED  TROPONIN I (HIGH SENSITIVITY)   ____________________________________________  EKG  ED ECG REPORT I, Chesley Noon, the attending physician, personally viewed and interpreted this ECG.   Date: 02/20/2020  EKG Time: 9:49  Rate: 81  Rhythm: normal sinus rhythm  Axis: LAD  Intervals:none  ST&T Change: None   PROCEDURES  Procedure(s) performed (including Critical Care):  Procedures   ____________________________________________   INITIAL IMPRESSION / ASSESSMENT AND PLAN / ED COURSE       55 year old male with past medical history of hypertension who presents to the ED following syncopal episode after standing up from the toilet and feeling lightheaded. EKG shows no evidence of arrhythmia or ischemia, low suspicion for cardiac etiology of his syncopal episode. We will screen labs including troponin, hydrate patient with IV fluids and treat nausea with Zofran. There is likely an element of dehydration following his recent diagnosis of COVID-19 with nausea and poor p.o. intake. If work-up is unremarkable, patient be appropriate for discharge home with PCP or cardiology follow-up.  Troponin within normal limits and patient reports feeling better following Zofran and IV fluids.  He is appropriate for discharge home with  cardiology follow-up, was counseled to return to the ED for new worsening symptoms.  Patient agrees with plan.      ____________________________________________   FINAL CLINICAL IMPRESSION(S) / ED DIAGNOSES  Final diagnoses:  Syncope, unspecified syncope type  COVID-19     ED Discharge Orders         Ordered    ondansetron (ZOFRAN ODT) 4 MG disintegrating tablet  Every 8 hours PRN        02/20/20 1204           Note:  This document was prepared using Dragon voice recognition software and may include unintentional dictation errors.   Chesley Noon, MD 02/20/20 [phone]

## 2020-02-20 NOTE — ED Triage Notes (Signed)
Pt to ED via POV with c/o syncope. Pt reports testing positive for covid 02/12/20. Pt states unknown how long he was unconscious for. Pt denies any pain at this time. VSS in triage. Pt ambulatory with steady gait to triage room.

## 2020-02-20 NOTE — ED Notes (Signed)
E-signature not working at this time. Pt verbalized understanding of D/C instructions, prescriptions and follow up care with no further questions at this time. Pt in NAD and ambulatory at time of D/C.  

## 2020-04-18 ENCOUNTER — Emergency Department
Admission: EM | Admit: 2020-04-18 | Discharge: 2020-04-18 | Disposition: A | Discharge status: Home / Self Care | Payer: BC Managed Care – PPO | Attending: Emergency Medicine | Admitting: Emergency Medicine

## 2020-04-18 ENCOUNTER — Emergency Department: Payer: BC Managed Care – PPO

## 2020-04-18 ENCOUNTER — Other Ambulatory Visit: Payer: Self-pay

## 2020-04-18 DIAGNOSIS — I10 Essential (primary) hypertension: Secondary | ICD-10-CM | POA: Insufficient documentation

## 2020-04-18 DIAGNOSIS — R0602 Shortness of breath: Secondary | ICD-10-CM | POA: Insufficient documentation

## 2020-04-18 DIAGNOSIS — Z79899 Other long term (current) drug therapy: Secondary | ICD-10-CM | POA: Diagnosis not present

## 2020-04-18 DIAGNOSIS — R079 Chest pain, unspecified: Secondary | ICD-10-CM | POA: Diagnosis not present

## 2020-04-18 LAB — COMPREHENSIVE METABOLIC PANEL
ALT: 16 U/L (ref 0–44)
AST: 19 U/L (ref 15–41)
Albumin: 4.1 g/dL (ref 3.5–5.0)
Alkaline Phosphatase: 56 U/L (ref 38–126)
Anion gap: 6 (ref 5–15)
BUN: 11 mg/dL (ref 6–20)
CO2: 25 mmol/L (ref 22–32)
Calcium: 9.2 mg/dL (ref 8.9–10.3)
Chloride: 108 mmol/L (ref 98–111)
Creatinine, Ser: 0.96 mg/dL (ref 0.61–1.24)
GFR, Estimated: >60 mL/min (ref >60)
Glucose, Bld: 113 mg/dL — ABNORMAL HIGH (ref 70–99)
Potassium: 3.7 mmol/L (ref 3.5–5.1)
Sodium: 139 mmol/L (ref 135–145)
Total Bilirubin: 0.8 mg/dL (ref 0.3–1.2)
Total Protein: 7.8 g/dL (ref 6.5–8.1)

## 2020-04-18 LAB — CBC
HCT: 41.8 % (ref 39.0–52.0)
Hemoglobin: 13.9 g/dL (ref 13.0–17.0)
MCH: 28.1 pg (ref 26.0–34.0)
MCHC: 33.3 g/dL (ref 30.0–36.0)
MCV: 84.6 fL (ref 80.0–100.0)
Platelets: 231 10*3/uL (ref 150–400)
RBC: 4.94 MIL/uL (ref 4.22–5.81)
RDW: 14.6 % (ref 11.5–15.5)
WBC: 3.4 10*3/uL — ABNORMAL LOW (ref 4.0–10.5)
nRBC: 0 % (ref 0.0–0.2)

## 2020-04-18 LAB — TROPONIN I (HIGH SENSITIVITY)
Troponin I (High Sensitivity): 3 ng/L (ref <18)
Troponin I (High Sensitivity): 3 ng/L (ref <18)

## 2020-04-18 LAB — LIPASE, BLOOD: Lipase: 35 U/L (ref 11–51)

## 2020-04-18 NOTE — ED Triage Notes (Signed)
Pt to ED POV for chest pain that radiated across right, left center chest that started this morning with shob. Denies n.v. Pt ambulatory to treatment room, speaking in complete sentences. States recently treated for htn

## 2020-04-18 NOTE — ED Provider Notes (Signed)
[facility] Emergency Department Provider Note  Time seen: 10:19 AM  I have reviewed the triage vital signs and the nursing notes.   HISTORY  Chief Complaint Chest Pain   HPI Mark Mccall is a 55 y.o. male with a past medical history of hypertension, presents to the emergency department for chest pain.  According to the patient since awakening this morning he has been experiencing pain across the center of his chest along some mild shortness of breath.  Patient denies any nausea vomiting or diaphoresis.  Patient states a history of hypertension his PCP recently increase his medication.  Patient denies any history of cardiac disease or heart attack/stents.  Patient denies any recent fever cough.  Largely negative review of systems.   Past Medical History:  Diagnosis Date  . Hypertension     Patient Active Problem List   Diagnosis Date Noted  . Prediabetes 08/08/2018  . Syncope 08/07/2018  . Pulmonary nodule 08/07/2018    No past surgical history on file.  Prior to Admission medications   Medication Sig Start Date End Date Taking? Authorizing Provider  lisinopril (ZESTRIL) 10 MG tablet Take 10 mg by mouth daily. 06/25/18   [provider]  ondansetron (ZOFRAN ODT) 4 MG disintegrating tablet Take 1 tablet (4 mg total) by mouth every 8 (eight) hours as needed for nausea or vomiting. 02/20/20   Chesley Noon, MD    No Known Allergies  No family history on file.  Social History Social History   Tobacco Use  . Smoking status: Never Smoker  . Smokeless tobacco: Never Used  Vaping Use  . Vaping Use: Never used  Substance Use Topics  . Alcohol use: Not Currently  . Drug use: Never    Review of Systems Constitutional: Negative for fever. Cardiovascular: Moderate chest pain 4/10 dull pain. Respiratory: Negative for shortness of breath. Gastrointestinal: Negative for abdominal pain Musculoskeletal: Negative for musculoskeletal  complaints Neurological: Negative for headache All other ROS negative  ____________________________________________   PHYSICAL EXAM:  VITAL SIGNS: ED Triage Vitals  Enc Vitals Group     BP 04/18/20 1016 (!) 163/100     Pulse Rate 04/18/20 1013 92     Resp 04/18/20 1013 16     Temp 04/18/20 1016 98.2 F (36.8 C)     Temp Source 04/18/20 1016 Oral     SpO2 04/18/20 1016 98 %     Weight 04/18/20 1014 230 lb (104.3 kg)     Height 04/18/20 1014 6\' 4"  (1.93 m)     Head Circumference --      Peak Flow --      Pain Score 04/18/20 1014 4     Pain Loc --      Pain Edu? --      Excl. in GC? --     Constitutional: Alert and oriented. Well appearing and in no distress. Eyes: Normal exam ENT      Head: Normocephalic and atraumatic.      Mouth/Throat: Mucous membranes are moist. Cardiovascular: Normal rate, regular rhythm.  Respiratory: Normal respiratory effort without tachypnea nor retractions. Breath sounds are clear Gastrointestinal: Soft and nontender. No distention.   Musculoskeletal: Nontender with normal range of motion in all extremities. No lower extremity tenderness or edema. Neurologic:  Normal speech and language. No gross focal neurologic deficits  Skin:  Skin is warm, dry and intact.  Psychiatric: Mood and affect are normal.   ____________________________________________    EKG  EKG viewed and  interpreted by myself shows normal sinus rhythm at 69 bpm with a narrow QRS, normal axis, normal intervals, no concerning ST changes.  ____________________________________________    RADIOLOGY  Chest x-ray is negative  ____________________________________________   INITIAL IMPRESSION / ASSESSMENT AND PLAN / ED COURSE  Pertinent labs & imaging results that were available during my care of the patient were reviewed by me and considered in my medical decision making (see chart for details).   Patient presents emergency department for chest pain beginning this morning  along with some mild shortness of breath.  Overall the patient appears well, reassuring physical exam, patient is hypertensive 160/100.  We will check labs including cardiac enzymes, EKG and a chest x-ray we will continue to closely monitor.  Patient's EKG is reassuring, labs and chest x-ray pending.  Differential would include ACS, CHF/pulmonary edema, pneumonia or pneumothorax, musculoskeletal pain.  Lab work is reassuring including negative troponin x2.  Blood pressure improving now 154/98 without any intervention in the emergency department.  Chest x-ray is clear.  Patient feeling well.  I discussed with the patient very strict chest pain return precautions for any return of pain or any shortness of breath.  Patient agreeable plan of care.  Will follow up with his doctor.  Mark Mccall was evaluated in Emergency Department on 04/18/2020 for the symptoms described in the history of present illness. He was evaluated in the context of the global COVID-19 pandemic, which necessitated consideration that the patient might be at risk for infection with the SARS-CoV-2 virus that causes COVID-19. Institutional protocols and algorithms that pertain to the evaluation of patients at risk for COVID-19 are in a state of rapid change based on information released by regulatory bodies including the CDC and federal and state organizations. These policies and algorithms were followed during the patient's care in the ED.  ____________________________________________   FINAL CLINICAL IMPRESSION(S) / ED DIAGNOSES  Chest pain   Minna Antis, MD 04/18/20 1330

## 2020-04-23 ENCOUNTER — Other Ambulatory Visit: Payer: Self-pay | Admitting: Family Medicine

## 2020-04-23 DIAGNOSIS — Z87898 Personal history of other specified conditions: Secondary | ICD-10-CM

## 2020-05-15 ENCOUNTER — Ambulatory Visit: Admission: RE | Admit: 2020-05-15 | Payer: BC Managed Care – PPO | Source: Ambulatory Visit

## 2021-07-23 IMAGING — DX DG KNEE COMPLETE 4+V*L*
4 series · 4 of 4 positions shown · non-contrast
Comparison: None.

CLINICAL DATA: Left knee pain and swelling

EXAM:
LEFT KNEE - COMPLETE 4+ VIEW

[knee ap]
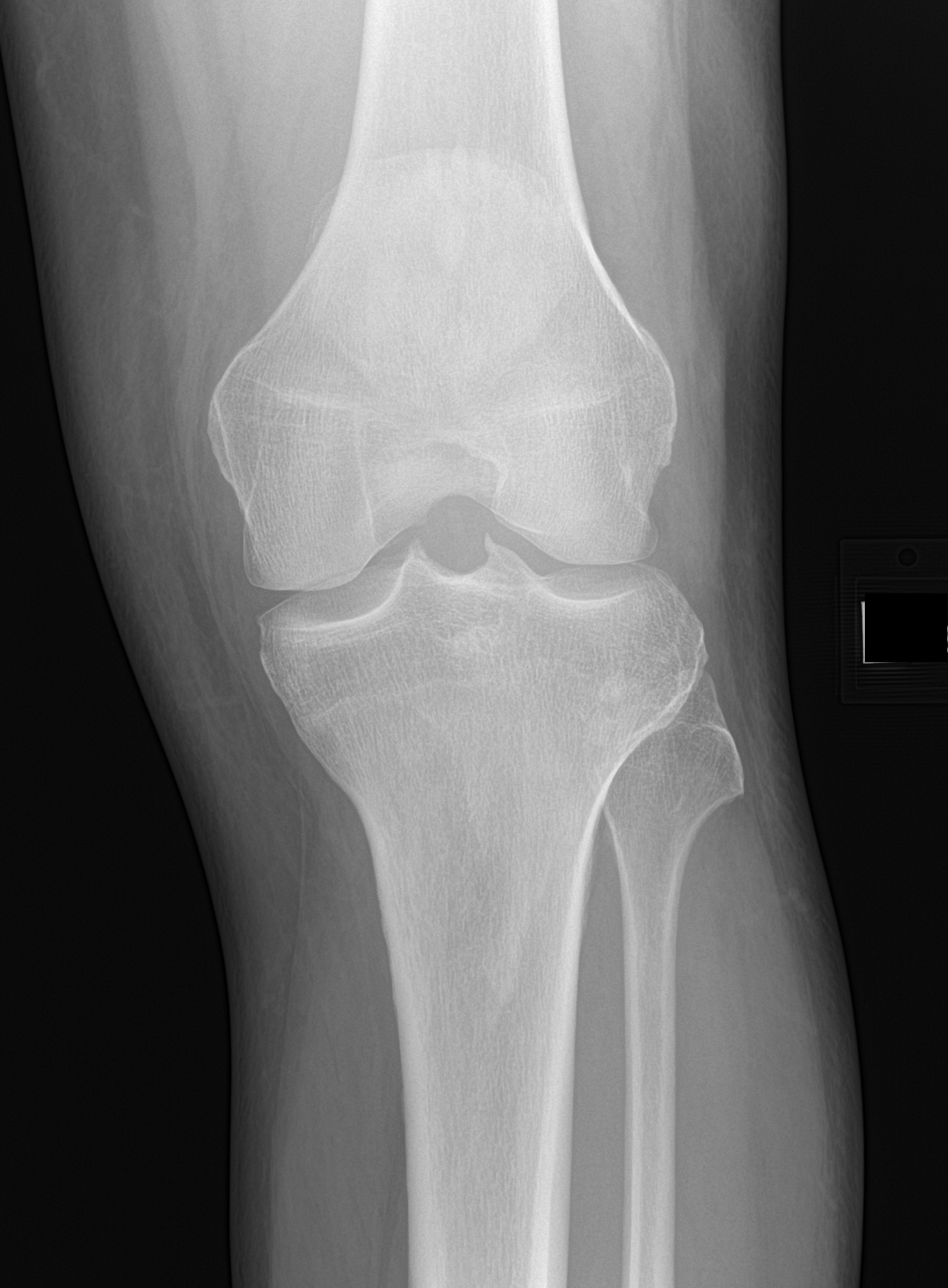

[knee obl (1 of 2)]
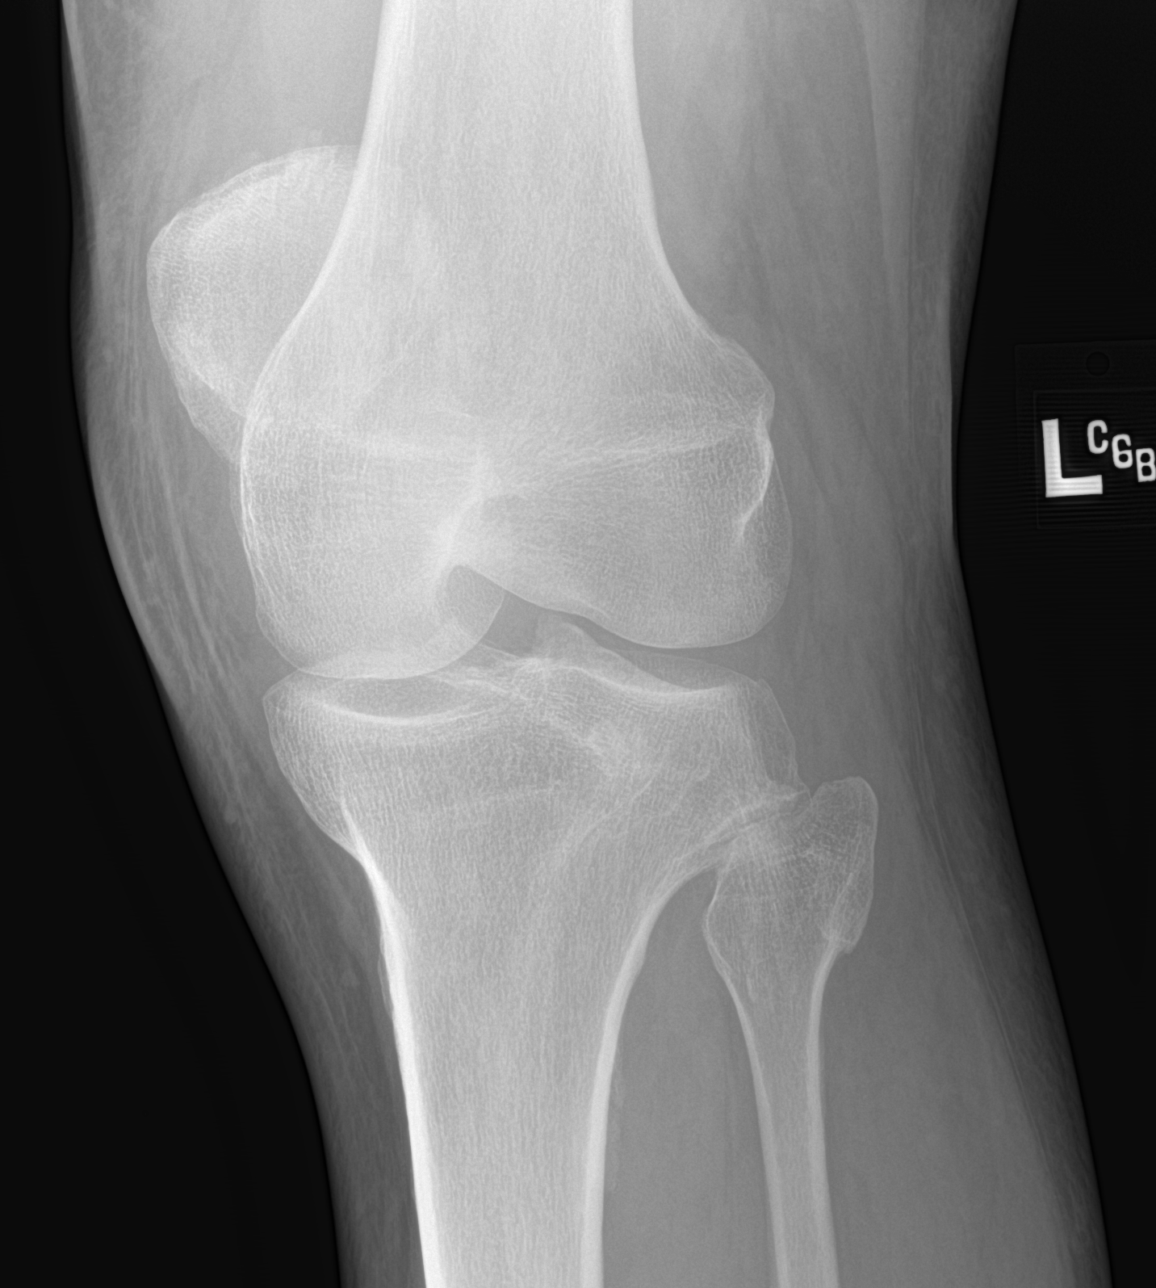

[knee lat]
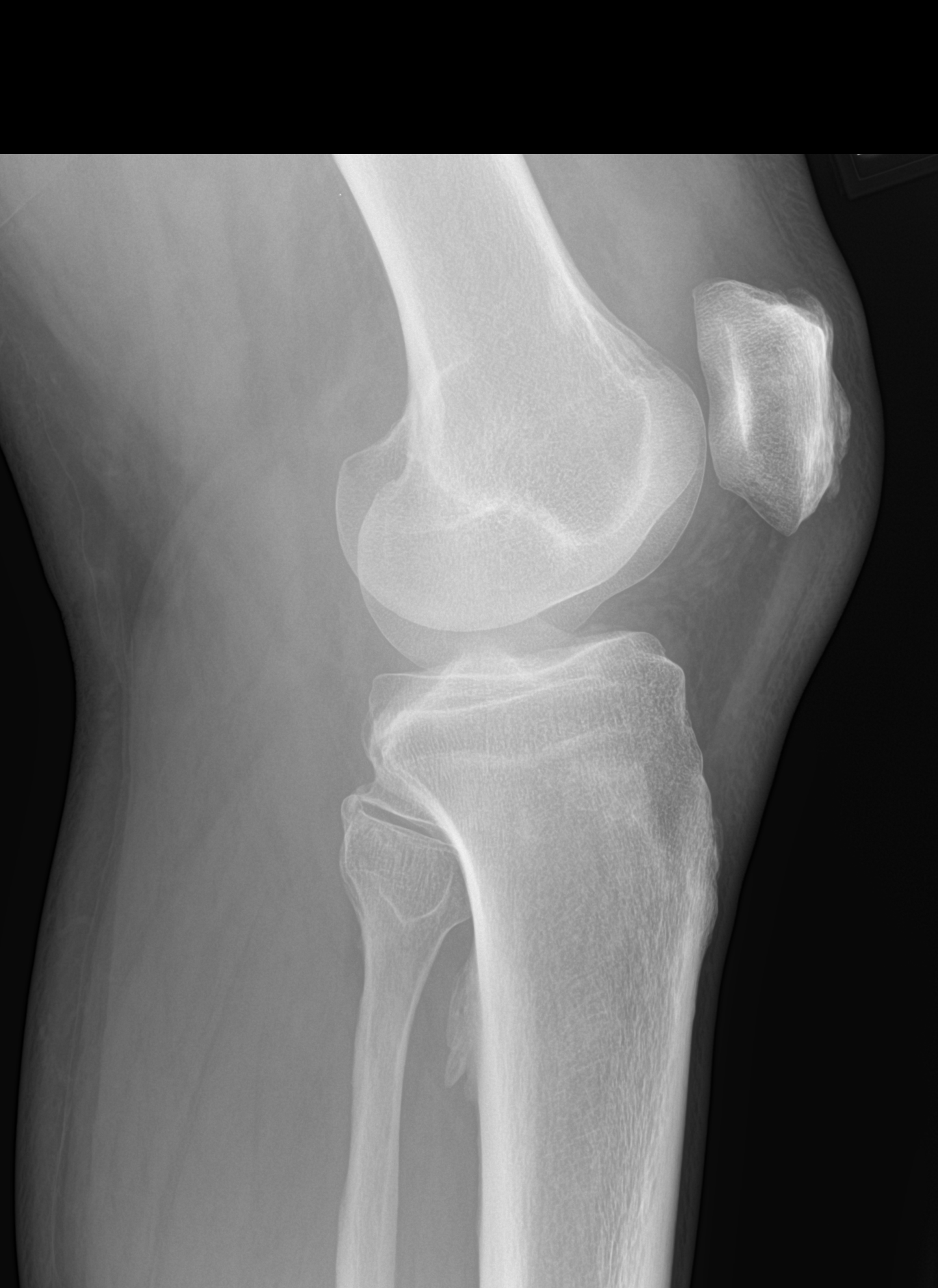

[knee obl (2 of 2)]
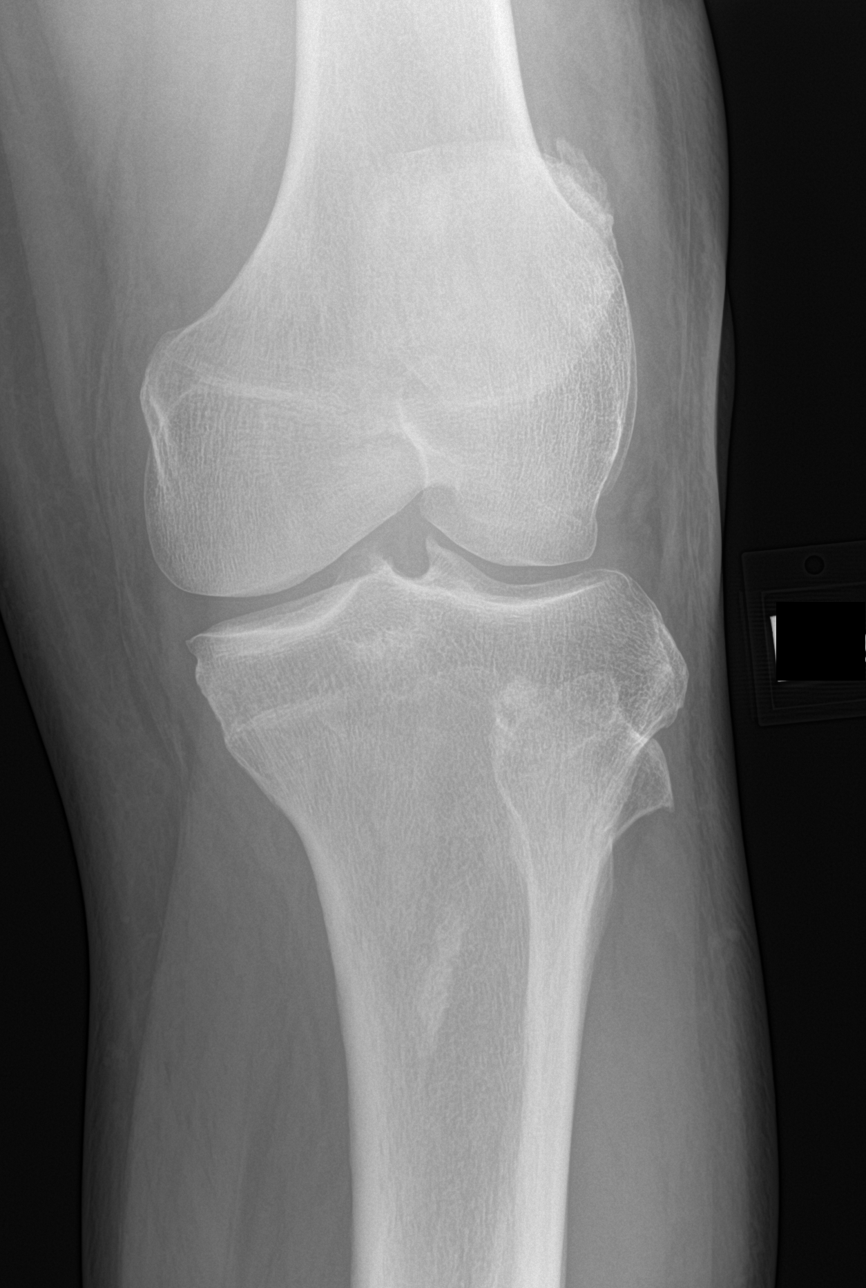

[4 of 4 positions shown; findings below may reference images not displayed]

FINDINGS: Normal alignment without acute osseous finding or fracture. No
subluxation or dislocation. Small joint effusion suspected
superiorly. No significant degenerative process.
IMPRESSION: Suspect small joint effusion.

No other acute finding by plain radiography

## 2023-01-05 ENCOUNTER — Encounter: Payer: Self-pay | Admitting: Family Medicine

## 2023-02-10 ENCOUNTER — Inpatient Hospital Stay: Payer: BC Managed Care – PPO | Attending: Oncology | Admitting: Oncology

## 2023-02-10 ENCOUNTER — Inpatient Hospital Stay: Payer: BC Managed Care – PPO

## 2023-02-10 ENCOUNTER — Encounter: Payer: Self-pay | Admitting: Oncology

## 2023-02-10 VITALS — BP 150/111 | HR 70 | Temp 95.9°F | Ht 76.0 in | Wt 214.5 lb

## 2023-02-10 DIAGNOSIS — D72819 Decreased white blood cell count, unspecified: Secondary | ICD-10-CM

## 2023-02-10 LAB — CBC (CANCER CENTER ONLY)
HCT: 45.2 % (ref 39.0–52.0)
Hemoglobin: 15.2 g/dL (ref 13.0–17.0)
MCH: 28.4 pg (ref 26.0–34.0)
MCHC: 33.6 g/dL (ref 30.0–36.0)
MCV: 84.3 fL (ref 80.0–100.0)
Platelet Count: 255 10*3/uL (ref 150–400)
RBC: 5.36 MIL/uL (ref 4.22–5.81)
RDW: 12.6 % (ref 11.5–15.5)
WBC Count: 2.9 10*3/uL — ABNORMAL LOW (ref 4.0–10.5)
nRBC: 0 % (ref 0.0–0.2)

## 2023-02-10 LAB — FERRITIN: Ferritin: 37 ng/mL (ref 24–336)

## 2023-02-10 LAB — IRON AND TIBC
Iron: 80 ug/dL (ref 45–182)
Saturation Ratios: 19 % (ref 17.9–39.5)
TIBC: 431 ug/dL (ref 250–450)
UIBC: 351 ug/dL

## 2023-02-10 LAB — FOLATE: Folate: 17.7 ng/mL (ref >5.9)

## 2023-02-10 LAB — VITAMIN B12: Vitamin B-12: 646 pg/mL (ref 180–914)

## 2023-02-10 NOTE — Progress Notes (Signed)
[facility]  Telephone:([phone]) [phone] Fax:([phone]) [phone]  ID: Mark Mccall OB: Jun 11, 1965  MR#: [id number]  DGL#:[id number]  Patient Care Team: Marisue Ivan, MD as PCP - General (Family Medicine) Patient, No Pcp Per (General Practice) [name], MD as Consulting Physician (Oncology)  CHIEF COMPLAINT: Leukopenia.  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  He was referred for further evaluation.  He currently feels well and is asymptomatic.  He denies any recent fevers or illnesses.  He has no new medications.  He has a good appetite and denies weight loss.  He has no chest pain, shortness of breath, cough, or hemoptysis.  He denies any nausea, vomiting, constipation, or diarrhea.  He has no urinary complaints.  Patient feels at his baseline and offers no specific complaints today.  REVIEW OF SYSTEMS:   Review of Systems  Constitutional: Negative.  Negative for fever, malaise/fatigue and weight loss.  Respiratory: Negative.  Negative for cough, hemoptysis and shortness of breath.   Cardiovascular: Negative.  Negative for chest pain and leg swelling.  Gastrointestinal: Negative.  Negative for abdominal pain.  Genitourinary: Negative.  Negative for dysuria.  Musculoskeletal: Negative.  Negative for back pain.  Skin: Negative.  Negative for rash.  Neurological: Negative.  Negative for dizziness, focal weakness, weakness and headaches.  Psychiatric/Behavioral: Negative.  The patient is not nervous/anxious.     As per HPI. Otherwise, a complete review of systems is negative.  PAST MEDICAL HISTORY: Past Medical History:  Diagnosis Date   Hypertension     PAST SURGICAL HISTORY: History reviewed. No pertinent surgical history.  FAMILY HISTORY: History reviewed. No pertinent family history.  ADVANCED DIRECTIVES (Y/N):  N  HEALTH MAINTENANCE: Social History   Tobacco Use   Smoking  status: Never   Smokeless tobacco: Never  Vaping Use   Vaping status: Never Used  Substance Use Topics   Alcohol use: Not Currently   Drug use: Never     Colonoscopy:  PAP:  Bone density:  Lipid panel:  No Known Allergies  Current Outpatient Medications  Medication Sig Dispense Refill   lisinopril (ZESTRIL) 10 MG tablet Take 10 mg by mouth daily.     ondansetron (ZOFRAN ODT) 4 MG disintegrating tablet Take 1 tablet (4 mg total) by mouth every 8 (eight) hours as needed for nausea or vomiting. 12 tablet 0   No current facility-administered medications for this visit.    OBJECTIVE: Vitals:   02/10/23 1326  BP: (!) 150/111  Pulse: 70  Temp: (!) 95.9 F (35.5 C)  SpO2: 100%     Body mass index is 26.11 kg/m.    ECOG FS:0 - Asymptomatic  General: Well-developed, well-nourished, no acute distress. Eyes: Pink conjunctiva, anicteric sclera. HEENT: Normocephalic, moist mucous membranes. Lungs: No audible wheezing or coughing. Heart: Regular rate and rhythm. Abdomen: Soft, nontender, no obvious distention. Musculoskeletal: No edema, cyanosis, or clubbing. Neuro: Alert, answering all questions appropriately. Cranial nerves grossly intact. Skin: No rashes or petechiae noted. Psych: Normal affect. Lymphatics: No cervical, calvicular, axillary or inguinal LAD.   LAB RESULTS:  Lab Results  Component Value Date   NA 139 04/18/2020   K 3.7 04/18/2020   CL 108 04/18/2020   CO2 25 04/18/2020   GLUCOSE 113 (H) 04/18/2020   BUN 11 04/18/2020   CREATININE 0.96 04/18/2020   CALCIUM 9.2 04/18/2020   PROT 7.8 04/18/2020   ALBUMIN 4.1 04/18/2020   AST 19 04/18/2020  ALT 16 04/18/2020   ALKPHOS 56 04/18/2020   BILITOT 0.8 04/18/2020   GFRNONAA >60 04/18/2020   GFRAA >60 09/21/2018    Lab Results  Component Value Date   WBC 2.9 (L) 02/10/2023   NEUTROABS 2.9 09/21/2018   HGB 15.2 02/10/2023   HCT 45.2 02/10/2023   MCV 84.3 02/10/2023   PLT 255 02/10/2023      STUDIES: No results found.  ASSESSMENT: Leukopenia.  PLAN:    Leukopenia: Upon review of patient's chart it appears patient has had an intermittent leukopenia since at least July 2019 and a persistent leukopenia since February 2022.  Patient reports that he also was told he had a low white blood cell count 20 years ago when living in [place].  No intervention is needed.  Patient does not require bone marrow biopsy.  All of his laboratory work including peripheral blood flow cytometry is pending at time of dictation.  Patient will have video-assisted telemedicine visit in 3 weeks to discuss the results.  I spent a total of 45 minutes reviewing chart data, face-to-face evaluation with the patient, counseling and coordination of care as detailed above.  Patient expressed understanding and was in agreement with this plan. He also understands that He can call clinic at any time with any questions, concerns, or complaints.     [name], MD   02/10/2023 4:13 PM

## 2023-02-14 LAB — COMP PANEL: LEUKEMIA/LYMPHOMA

## 2023-02-22 LAB — INTELLIGEN MYELOID

## 2023-03-03 ENCOUNTER — Inpatient Hospital Stay: Payer: BC Managed Care – PPO | Admitting: Oncology

## 2023-03-04 ENCOUNTER — Telehealth: Payer: Self-pay | Admitting: *Deleted

## 2023-03-04 NOTE — Progress Notes (Signed)
This encounter was created in error - please disregard.

## 2023-03-04 NOTE — Telephone Encounter (Signed)
Pt. Called to say that he wants someone to call him a call back about his labs from 2 weeks ago.

## 2023-03-10 ENCOUNTER — Encounter: Payer: Self-pay | Admitting: Oncology

## 2023-03-10 ENCOUNTER — Inpatient Hospital Stay: Payer: BC Managed Care – PPO | Attending: Oncology | Admitting: Oncology

## 2023-03-10 ENCOUNTER — Telehealth: Payer: Self-pay | Admitting: *Deleted

## 2023-03-10 NOTE — Telephone Encounter (Signed)
 Pt. Wants someone to give him the results of  lbs from 2 weeks ago.He did not do the phone appt last week

## 2023-03-13 ENCOUNTER — Other Ambulatory Visit: Payer: Self-pay

## 2023-03-13 ENCOUNTER — Emergency Department
Admission: EM | Admit: 2023-03-13 | Discharge: 2023-03-13 | Disposition: A | Discharge status: Home / Self Care | Payer: BC Managed Care – PPO | Attending: Emergency Medicine | Admitting: Emergency Medicine

## 2023-03-13 DIAGNOSIS — J029 Acute pharyngitis, unspecified: Secondary | ICD-10-CM | POA: Insufficient documentation

## 2023-03-13 DIAGNOSIS — I10 Essential (primary) hypertension: Secondary | ICD-10-CM | POA: Insufficient documentation

## 2023-03-13 LAB — RESP PANEL BY RT-PCR (RSV, FLU A&B, COVID)  RVPGX2
Influenza A by PCR: NEGATIVE
Influenza B by PCR: NEGATIVE
Resp Syncytial Virus by PCR: NEGATIVE
SARS Coronavirus 2 by RT PCR: NEGATIVE

## 2023-03-13 LAB — GROUP A STREP BY PCR: Group A Strep by PCR: NOT DETECTED

## 2023-03-13 MED ORDER — AMOXICILLIN-POT CLAVULANATE 875-125 MG PO TABS
1.0000 | ORAL_TABLET | Freq: Once | ORAL | Status: AC
  Administered 2023-03-13: 1 via ORAL
  Filled 2023-03-13: qty 1

## 2023-03-13 MED ORDER — AMOXICILLIN-POT CLAVULANATE 875-125 MG PO TABS
1.0000 | ORAL_TABLET | Freq: Two times a day (BID) | ORAL | 0 refills | Status: AC
Start: 2023-03-13 — End: 2023-03-20

## 2023-03-13 NOTE — ED Provider Notes (Signed)
 [facility] Provider Note   Event Date/Time   First MD Initiated Contact with Patient 03/13/23 1724     (approximate) History  Sore Throat  HPI Mark Mccall is a 58 y.o. male with a past medical history of hypertension and prediabetes who presents for 1 week of sore throat and voice change.  Patient Mark Mccall is subjective fevers but has not checked his temperature.  Patient endorses occasional cough.  Patient denies any recent travel or sick contacts. ROS: Patient currently denies any vision changes, tinnitus, difficulty speaking, facial droop, chest pain, shortness of breath, abdominal pain, nausea/vomiting/diarrhea, dysuria, or weakness/numbness/paresthesias in any extremity   Physical Exam  Triage Vital Signs: ED Triage Vitals  Encounter Vitals Group     BP 03/13/23 1704 (!) 160/91     Systolic BP Percentile --      Diastolic BP Percentile --      Pulse Rate 03/13/23 1704 88     Resp 03/13/23 1704 20     Temp 03/13/23 1704 98.4 F (36.9 C)     Temp Source 03/13/23 1704 Oral     SpO2 03/13/23 1704 99 %     Weight 03/13/23 1702 215 lb (97.5 kg)     Height 03/13/23 1702 6\' 4"  (1.93 m)     Head Circumference --      Peak Flow --      Pain Score 03/13/23 1703 5     Pain Loc --      Pain Education --      Exclude from Growth Chart --    Most recent vital signs: Vitals:   03/13/23 1704  BP: (!) 160/91  Pulse: 88  Resp: 20  Temp: 98.4 F (36.9 C)  SpO2: 99%   General: Awake, oriented x4. CV:  Good peripheral perfusion.  Resp:  Normal effort.  Abd:  No distention.  Other:  Middle-aged well-developed, well-nourished African-American male resting comfortably in no acute distress.  Significantly erythematous posterior oropharynx with mild purulent drainage.  Painful anterior cervical lymphadenopathy ED Results / Procedures / Treatments  Labs (all labs ordered are listed, but only abnormal results are displayed) Labs Reviewed  GROUP A STREP BY PCR   RESP PANEL BY RT-PCR (RSV, FLU A&B, COVID)  [id number]  CULTURE, GROUP A STREP [facility])   PROCEDURES: Critical Care performed: No Procedures MEDICATIONS ORDERED IN ED: Medications  amoxicillin-clavulanate (AUGMENTIN) 875-125 MG per tablet 1 tablet (has no administration in time range)   IMPRESSION / MDM / ASSESSMENT AND PLAN / ED COURSE  I reviewed the triage vital signs and the nursing notes.                             The patient is on the cardiac monitor to evaluate for evidence of arrhythmia and/or significant heart rate changes. Patient's presentation is most consistent with acute presentation with potential threat to life or bodily function. 58 year old male presents for sore throat No history of immunocompromise. Nontoxic appearance. Patient euvolemic with no trismus. No airway compromise. No change in voice, exudates, enlarged lymph nodes. Able to tolerate PO. Given History and Exam I have low suspicion for this presentation being caused by PTA, RPA, Ludwigs angina, Epiglottitis or Bacterial Tracheitis, EBV, acute HIV.  Given patient's negative group A strep PCR, will send for culture given patient's physical exam showing significantly erythematous posterior oropharynx with what looks like purulent drainage and anterior cervical lymphadenopathy. Rx: Augmentin  Dispo: Discharge home   FINAL CLINICAL IMPRESSION(S) / ED DIAGNOSES   Final diagnoses:  Pharyngitis, unspecified etiology   Rx / DC Orders   ED Discharge Orders          Ordered    amoxicillin-clavulanate (AUGMENTIN) 875-125 MG tablet  2 times daily        03/13/23 1801           Note:  This document was prepared using Dragon voice recognition software and may include unintentional dictation errors.   Merwyn Katos, MD 03/13/23 [phone]

## 2023-03-13 NOTE — ED Triage Notes (Signed)
 Pt to ED for sore throat since 1 week. No fevers, occasional cough. Pt in NAD.

## 2023-03-16 LAB — CULTURE, GROUP A STREP (THRC)

## 2023-08-17 ENCOUNTER — Other Ambulatory Visit: Payer: Self-pay | Admitting: Family Medicine

## 2023-08-17 DIAGNOSIS — I10 Essential (primary) hypertension: Secondary | ICD-10-CM

## 2023-08-17 DIAGNOSIS — Z9189 Other specified personal risk factors, not elsewhere classified: Secondary | ICD-10-CM

## 2023-08-22 ENCOUNTER — Ambulatory Visit
Admission: RE | Admit: 2023-08-22 | Discharge: 2023-08-22 | Disposition: A | Discharge status: Home / Self Care | Payer: Self-pay | Source: Ambulatory Visit | Attending: Family Medicine | Admitting: Family Medicine

## 2023-08-22 DIAGNOSIS — Z9189 Other specified personal risk factors, not elsewhere classified: Secondary | ICD-10-CM | POA: Insufficient documentation

## 2023-08-22 DIAGNOSIS — I10 Essential (primary) hypertension: Secondary | ICD-10-CM | POA: Insufficient documentation
